# Patient Record
Sex: Female | Born: 1975 | Race: White | Hispanic: No | Marital: Single | Smoking: Former smoker
Health system: Southern US, Community
[De-identification: ages and names within clinical notes are randomized; demographics above are authoritative.]

## PROBLEM LIST (undated history)

## (undated) DIAGNOSIS — A499 Bacterial infection, unspecified: Secondary | ICD-10-CM

## (undated) DIAGNOSIS — J45909 Unspecified asthma, uncomplicated: Secondary | ICD-10-CM

## (undated) DIAGNOSIS — B379 Candidiasis, unspecified: Secondary | ICD-10-CM

## (undated) DIAGNOSIS — Z8659 Personal history of other mental and behavioral disorders: Secondary | ICD-10-CM

## (undated) DIAGNOSIS — M6282 Rhabdomyolysis: Secondary | ICD-10-CM

## (undated) DIAGNOSIS — Z8619 Personal history of other infectious and parasitic diseases: Secondary | ICD-10-CM

## (undated) DIAGNOSIS — R42 Dizziness and giddiness: Secondary | ICD-10-CM

## (undated) DIAGNOSIS — IMO0002 Reserved for concepts with insufficient information to code with codable children: Secondary | ICD-10-CM

## (undated) DIAGNOSIS — Z8742 Personal history of other diseases of the female genital tract: Secondary | ICD-10-CM

## (undated) HISTORY — DX: Personal history of other diseases of the female genital tract: Z87.42

## (undated) HISTORY — DX: Personal history of other infectious and parasitic diseases: Z86.19

## (undated) HISTORY — PX: WISDOM TOOTH EXTRACTION: SHX21

## (undated) HISTORY — DX: Personal history of other mental and behavioral disorders: Z86.59

## (undated) HISTORY — DX: Reserved for concepts with insufficient information to code with codable children: IMO0002

## (undated) HISTORY — DX: Bacterial infection, unspecified: A49.9

## (undated) HISTORY — DX: Rhabdomyolysis: M62.82

## (undated) HISTORY — DX: Candidiasis, unspecified: B37.9

## (undated) HISTORY — DX: Dizziness and giddiness: R42

## (undated) HISTORY — DX: Unspecified asthma, uncomplicated: J45.909

---
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
## 2010-12-23 DIAGNOSIS — Z8659 Personal history of other mental and behavioral disorders: Secondary | ICD-10-CM

## 2010-12-23 HISTORY — DX: Personal history of other mental and behavioral disorders: Z86.59

## 2011-06-09 DIAGNOSIS — Z8742 Personal history of other diseases of the female genital tract: Secondary | ICD-10-CM

## 2011-06-09 HISTORY — DX: Personal history of other diseases of the female genital tract: Z87.42

## 2011-08-29 ENCOUNTER — Encounter (INDEPENDENT_AMBULATORY_CARE_PROVIDER_SITE_OTHER): Payer: PRIVATE HEALTH INSURANCE | Admitting: Registered Nurse

## 2011-08-29 DIAGNOSIS — Z3046 Encounter for surveillance of implantable subdermal contraceptive: Secondary | ICD-10-CM

## 2011-12-19 ENCOUNTER — Telehealth: Payer: Self-pay | Admitting: Obstetrics and Gynecology

## 2011-12-19 NOTE — Telephone Encounter (Signed)
DONE

## 2011-12-20 ENCOUNTER — Ambulatory Visit (INDEPENDENT_AMBULATORY_CARE_PROVIDER_SITE_OTHER): Payer: PRIVATE HEALTH INSURANCE | Admitting: Obstetrics and Gynecology

## 2011-12-20 ENCOUNTER — Encounter: Payer: Self-pay | Admitting: Obstetrics and Gynecology

## 2011-12-20 VITALS — BP 118/78 | Resp 16 | Ht 64.0 in | Wt 120.0 lb

## 2011-12-20 DIAGNOSIS — F603 Borderline personality disorder: Secondary | ICD-10-CM

## 2011-12-20 DIAGNOSIS — D649 Anemia, unspecified: Secondary | ICD-10-CM | POA: Insufficient documentation

## 2011-12-20 DIAGNOSIS — F5002 Anorexia nervosa, binge eating/purging type: Secondary | ICD-10-CM

## 2011-12-20 DIAGNOSIS — F50029 Anorexia nervosa, binge eating/purging type, unspecified: Secondary | ICD-10-CM | POA: Insufficient documentation

## 2011-12-20 DIAGNOSIS — F3289 Other specified depressive episodes: Secondary | ICD-10-CM

## 2011-12-20 DIAGNOSIS — F5000 Anorexia nervosa, unspecified: Secondary | ICD-10-CM

## 2011-12-20 DIAGNOSIS — Z309 Encounter for contraceptive management, unspecified: Secondary | ICD-10-CM

## 2011-12-20 DIAGNOSIS — N943 Premenstrual tension syndrome: Secondary | ICD-10-CM

## 2011-12-20 DIAGNOSIS — F329 Major depressive disorder, single episode, unspecified: Secondary | ICD-10-CM

## 2011-12-20 NOTE — Progress Notes (Signed)
Regular Periods: no Mammogram: no  Monthly Breast Ex.: no Exercise: yes  Tetanus < 10 years: yes Seatbelts: yes  NI. Bladder Functn.: no Abuse at home: no  Daily BM's: no Stressful Work: yes  Healthy Diet: no Sigmoid-Colonoscopy: never  Calcium: no Medical problems this year: Wants to change BC Lo Loestrin causing irreg. Menses and too much weight gain. Unsure which BC   LAST PAP: 12/23/10 WNL  Contraception: Lo Loestrin  Mammogram:  never  PCP: none  PMH: no changes  FMH: no changes  *Pt reqs pap smear today but it hasn't been a year yet - Last pap 12/23/2010

## 2011-12-20 NOTE — Progress Notes (Signed)
S: Pt has been on LoLoestrin x4 mos, c/o severe emotional lability, depression/anxiety, wgt gain/bloating states it has slightly improved when she's at the end of pill pack w onset of menses.  She had used nexplanon w emotional side effects but had metrorrhagia x8 mos, so it was removed in March.  She states she is not in relationship and only has 1 female partner that she is with every few months.  She has history of borderline personality disorder and eating disorder.   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

## 2011-12-21 ENCOUNTER — Other Ambulatory Visit: Payer: PRIVATE HEALTH INSURANCE

## 2011-12-21 DIAGNOSIS — D649 Anemia, unspecified: Secondary | ICD-10-CM

## 2011-12-21 DIAGNOSIS — N943 Premenstrual tension syndrome: Secondary | ICD-10-CM

## 2011-12-21 DIAGNOSIS — Z309 Encounter for contraceptive management, unspecified: Secondary | ICD-10-CM

## 2011-12-21 LAB — CBC
HCT: 42.5 % (ref 36.0–46.0)
MCHC: 33.9 g/dL (ref 30.0–36.0)
Platelets: 301 10*3/uL (ref 150–400)
RDW: 13.6 % (ref 11.5–15.5)
WBC: 5.6 10*3/uL (ref 4.0–10.5)

## 2011-12-22 LAB — VITAMIN D 25 HYDROXY (VIT D DEFICIENCY, FRACTURES): Vit D, 25-Hydroxy: 41 ng/mL (ref 30–89)

## 2011-12-22 LAB — TSH: TSH: 1.862 u[IU]/mL (ref 0.350–4.500)

## 2012-02-16 ENCOUNTER — Encounter: Payer: PRIVATE HEALTH INSURANCE | Admitting: Obstetrics and Gynecology

## 2012-02-22 ENCOUNTER — Ambulatory Visit: Payer: PRIVATE HEALTH INSURANCE | Admitting: Obstetrics and Gynecology

## 2014-06-18 ENCOUNTER — Ambulatory Visit (INDEPENDENT_AMBULATORY_CARE_PROVIDER_SITE_OTHER): Payer: BC Managed Care – PPO

## 2014-06-18 ENCOUNTER — Ambulatory Visit (INDEPENDENT_AMBULATORY_CARE_PROVIDER_SITE_OTHER): Payer: BC Managed Care – PPO | Admitting: Family Medicine

## 2014-06-18 VITALS — BP 92/68 | HR 71 | Temp 98.0°F | Resp 15 | Ht 64.0 in | Wt 114.0 lb

## 2014-06-18 DIAGNOSIS — M79674 Pain in right toe(s): Secondary | ICD-10-CM

## 2014-06-18 DIAGNOSIS — S92401A Displaced unspecified fracture of right great toe, initial encounter for closed fracture: Secondary | ICD-10-CM

## 2014-06-18 NOTE — Patient Instructions (Signed)
Wear walking boot for next 10 days and then recheck for repeat xray. If stable, plan on using walking boot a total of 3 weeks, then buddy taping and hard soled shoe for another 2-3 weeks.  Tylenol for pain, or can also take ibuprofen if needed. Return to the clinic or go to the nearest emergency room if any of your symptoms worsen or new symptoms occur.  Toe Fracture Your caregiver has diagnosed you as having a fractured toe. A toe fracture is a break in the bone of a toe. "Buddy taping" is a way of splinting your broken toe, by taping the broken toe to the toe next to it. This "buddy taping" will keep the injured toe from moving beyond normal range of motion. Buddy taping also helps the toe heal in a more normal alignment. It may take 6 to 8 weeks for the toe injury to heal. [place] your toes taped together for as long as directed by your caregiver or until you see a doctor for a follow-up examination. You can change the tape after bathing. Always use a small piece of gauze or cotton between the toes when taping them together. This will help the skin stay dry and prevent infection.  Apply ice to the injury for 15-20 minutes each hour while awake for the first 2 days. Put the ice in a plastic bag and place a towel between the bag of ice and your skin.  After the first 2 days, apply heat to the injured area. Use heat for the next 2 to 3 days. Place a heating pad on the foot or soak the foot in warm water as directed by your caregiver.  Keep your foot elevated as much as possible to lessen swelling.  Wear sturdy, supportive shoes. The shoes should not pinch the toes or fit tightly against the toes.  Your caregiver may prescribe a rigid shoe if your foot is very swollen.  Your may be given crutches if the pain is too great and it hurts too much to walk.  Only take over-the-counter or prescription medicines for pain, discomfort, or fever as directed by your caregiver.  If  your caregiver has given you a follow-up appointment, it is very important to keep that appointment. Not keeping the appointment could result in a chronic or permanent injury, pain, and disability. If there is any problem keeping the appointment, you must call back to this facility for assistance. SEEK MEDICAL CARE IF:   You have increased pain or swelling, not relieved with medications.  The pain does not get better after 1 week.  Your injured toe is cold when the others are warm. SEEK IMMEDIATE MEDICAL CARE IF:   The toe becomes cold, numb, or white.  The toe becomes hot (inflamed) and red. Document Released: 05/26/2000 Document Revised: 08/21/2011 Document Reviewed: 01/13/2008 [facility] Patient Information 2015 [place], [place]. This information is not intended to replace advice given to you by your health care provider. Make sure you discuss any questions you have with your health care provider.

## 2014-06-18 NOTE — Progress Notes (Addendum)
Subjective:    Patient ID: Lori Hutchinson, female    DOB: Jan 12, 1976, 39 y.o.   MRN: [id number] This chart was scribed for [name], MD by Jeanell Sparrow, ED Scribe. This patient was seen in room 1 and the patient's care was started at 8:49 AM.  Toe Pain     HPI Comments: Lori Hutchinson is a 39 y.o. female who presents to the [facility] complaining of right toe injury.  She states that she was doing an aerial done and she came from 4-5 ft high and landed on her right foot wrong. She reports that she is unsure of how she landed on her right foot. She reports that she upon waking up from a nap her right great toe was purple and had constant moderate pain. She states that her toes are usually purple if cold. No other toe pain.   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   Patient Active Problem List   Diagnosis Date Noted  . Anemia 12/20/2011  . Borderline personality disorder 12/20/2011  . 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 PMS (premenstrual syndrome) 12/20/2011   Past Medical History  Diagnosis Date  . Asthma   . H/O varicella   . Yeast infection   . Bacterial infection   . Abnormal Pap smear 1996    Treatment cryosurgery  . H/O borderline personality disorder 12/23/10  . H/O metrorrhagia 06/09/11   Past Surgical History  Procedure Laterality Date  . Wisdom tooth extraction     Allergies  Allergen Reactions  . Erythromycin   . Iodine   . Latex   . Percocet [Oxycodone-Acetaminophen]    Prior to Admission medications   Not on File   History   Social History  . Marital Status: Single    Spouse Name: N/A    Number of Children: N/A  . Years of Education: N/A   Occupational History  . Not on file.   Social History Main Topics  . Smoking status: Former Smoker -- 13 years    Quit date: 06/18/2002  . Smokeless tobacco: Never Used  . Alcohol Use: No  . Drug Use: No  . Sexual Activity:    Partners: Male    Birth Control/  Protection: Pill     Comment: lo loestrin   Other Topics Concern  . Not on file   Social History Narrative   Review of Systems  Musculoskeletal: Positive for myalgias and arthralgias.      Objective:   Physical Exam  Constitutional: She is oriented to person, place, and time. She appears well-developed and well-nourished. No distress.  HENT:  Head: Normocephalic and atraumatic.  Neck: Neck supple. No tracheal deviation present.  Cardiovascular: Normal rate.   Pulmonary/Chest: Effort normal. No respiratory distress.  Musculoskeletal: Normal range of motion.  Right ankle full ROM and non tender. Right knee full ROM and non tender. No navicular or 5th MT pain. Most tender medial great toe IP joint and distal phalanx.  Cap refill is less than 1 sec. NVI distally.  Flexion and extension at the great toe is intact.  Minimal bruising on the medial great toe.   Neurological: She is alert and oriented to person, place, and time.  Skin: Skin is warm and dry.  Psychiatric: She has a normal mood and affect. Her behavior is normal.  Nursing note and vitals reviewed.   Filed Vitals:   06/18/14 0823  BP: 92/68  Pulse: 71  Temp: 98 F (36.7 C)  TempSrc: Oral  Resp: 15  Height: 5' 4"  (1.626 m)  Weight: 114 lb (51.71 kg)  SpO2: 99%   [facility] reading (PRIMARY) by  Dr. Carlota Raspberry: R great toe: nondisplaced distal phalynx base fx, medial nondisplaced, intraarticular affecting approx 25% joint surface.        Assessment & Plan:  Lori Hutchinson is a 39 y.o. female Pain of toe of right foot - Plan: DG Toe Great Right  Fractured great toe, right, closed, initial encounter  Nondisplaced, but great toe fracture - distal phalynx with 25% intraarticular. Will place in short cam walker, buddy tape, recheck in 10 days for repeat XR, then plan on continuing cam walker for 3 weeks, then could change to buddy tape with hard soled shoe for another 2-3 weeks. Tylenol or advil if needed. Out of dance/sport  for now as great toe involved. Anticipate 6 weeks to return to sport.  rtc sooner if worse.   No orders of the defined types were placed in this encounter.   Patient Instructions  Wear walking boot for next 10 days and then recheck for repeat xray. If stable, plan on using walking boot a total of 3 weeks, then buddy taping and hard soled shoe for another 2-3 weeks.  Tylenol for pain, or can also take ibuprofen if needed. Return to the clinic or go to the nearest emergency room if any of your symptoms worsen or new symptoms occur.  Toe Fracture Your caregiver has diagnosed you as having a fractured toe. A toe fracture is a break in the bone of a toe. "Buddy taping" is a way of splinting your broken toe, by taping the broken toe to the toe next to it. This "buddy taping" will keep the injured toe from moving beyond normal range of motion. Buddy taping also helps the toe heal in a more normal alignment. It may take 6 to 8 weeks for the toe injury to heal. [place] your toes taped together for as long as directed by your caregiver or until you see a doctor for a follow-up examination. You can change the tape after bathing. Always use a small piece of gauze or cotton between the toes when taping them together. This will help the skin stay dry and prevent infection.  Apply ice to the injury for 15-20 minutes each hour while awake for the first 2 days. Put the ice in a plastic bag and place a towel between the bag of ice and your skin.  After the first 2 days, apply heat to the injured area. Use heat for the next 2 to 3 days. Place a heating pad on the foot or soak the foot in warm water as directed by your caregiver.  Keep your foot elevated as much as possible to lessen swelling.  Wear sturdy, supportive shoes. The shoes should not pinch the toes or fit tightly against the toes.  Your caregiver may prescribe a rigid shoe if your foot is very swollen.  Your may be given  crutches if the pain is too great and it hurts too much to walk.  Only take over-the-counter or prescription medicines for pain, discomfort, or fever as directed by your caregiver.  If your caregiver has given you a follow-up appointment, it is very important to keep that appointment. Not keeping the appointment could result in a chronic or permanent injury, pain, and disability. If there is any problem keeping the appointment, you must  call back to this facility for assistance. SEEK MEDICAL CARE IF:   You have increased pain or swelling, not relieved with medications.  The pain does not get better after 1 week.  Your injured toe is cold when the others are warm. SEEK IMMEDIATE MEDICAL CARE IF:   The toe becomes cold, numb, or white.  The toe becomes hot (inflamed) and red. Document Released: 05/26/2000 Document Revised: 08/21/2011 Document Reviewed: 01/13/2008 [facility] Patient Information 2015 [place], [place]. This information is not intended to replace advice given to you by your health care provider. Make sure you discuss any questions you have with your health care provider.       I personally performed the services described in this documentation, which was scribed in my presence. The recorded information has been reviewed and considered, and addended by me as needed.

## 2014-06-28 ENCOUNTER — Ambulatory Visit (INDEPENDENT_AMBULATORY_CARE_PROVIDER_SITE_OTHER): Payer: BC Managed Care – PPO

## 2014-06-28 ENCOUNTER — Ambulatory Visit (INDEPENDENT_AMBULATORY_CARE_PROVIDER_SITE_OTHER): Payer: BC Managed Care – PPO | Admitting: Family Medicine

## 2014-06-28 VITALS — BP 112/62 | HR 61 | Temp 98.2°F | Resp 16 | Ht 64.0 in | Wt 111.0 lb

## 2014-06-28 DIAGNOSIS — S92401D Displaced unspecified fracture of right great toe, subsequent encounter for fracture with routine healing: Secondary | ICD-10-CM

## 2014-06-28 NOTE — Patient Instructions (Signed)
Continue walking boot for 1 more week, then can change to post op shoe and buddy taping, with plan for total immobilization of 6 weeks. Follow up with me on February 1st at appointment center. Return to the clinic or go to the nearest emergency room if any of your symptoms worsen or new symptoms occur.

## 2014-06-28 NOTE — Progress Notes (Signed)
Subjective:    Patient ID: Lori Hutchinson, female    DOB: September 08, 1975, 39 y.o.   MRN: [id number] This chart was scribed for [name], MD by Marti Sleigh, Medical Scribe. This patient was seen in Room 12 and the patient's care was started a 8:50 AM.  Chief Complaint  Patient presents with  . Follow-up    Ankle fx     HPI HPI Comments: Lori Hutchinson is a 39 y.o. female who presents to [facility] reporting for a follow up appointment. Pt states she is doing well, and not in pain. Pt states she is tired of wearing her boot, and is wondering when she will be able to discontinue wearing her boot. Pt denies numbness. No menses in past two years, but not sexually active. No chance of pregnancy today.  Pt was last seen by Dr. Carlota Raspberry 10 days ago, diagnosed with a fracture at the base of  Right great distal phalanx, non displaced but did involve the joint line. Pt was placed in a short cam walker, with plan to stay in this for three weeks, followed by buddy taping and hard sole shoe for two weeks. 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  Not needing pain medications.   Patient Active Problem List   Diagnosis Date Noted  . Anemia 12/20/2011  . Borderline personality disorder 12/20/2011  . 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 PMS (premenstrual syndrome) 12/20/2011   Past Medical History  Diagnosis Date  . Asthma   . H/O varicella   . Yeast infection   . Bacterial infection   . Abnormal Pap smear 1996    Treatment cryosurgery  . H/O borderline personality disorder 12/23/10  . H/O metrorrhagia 06/09/11   Past Surgical History  Procedure Laterality Date  . Wisdom tooth extraction     Allergies  Allergen Reactions  . Erythromycin   . Iodine   . Latex   . Percocet [Oxycodone-Acetaminophen]    Prior to Admission medications   Not on File   History   Social History  . Marital Status: Single    Spouse Name: N/A   Number of Children: N/A  . Years of Education: N/A   Occupational History  . Not on file.   Social History Main Topics  . Smoking status: Former Smoker -- 13 years    Quit date: 06/18/2002  . Smokeless tobacco: Never Used  . Alcohol Use: No  . Drug Use: No  . Sexual Activity:    Partners: Male    Birth Control/ Protection: Pill     Comment: lo loestrin   Other Topics Concern  . Not on file   Social History Narrative      Review of Systems  Constitutional: Negative for fever and chills.  Musculoskeletal: Positive for gait problem. Negative for joint swelling and arthralgias.  Skin: Positive for color change. Negative for rash and wound.  Neurological: Negative for numbness.       Objective:   Physical Exam  Constitutional: She is oriented to person, place, and time. She appears well-developed and well-nourished.  HENT:  Head: Normocephalic and atraumatic.  Eyes: Pupils are equal, round, and reactive to light.  Neck: Neck supple.  Cardiovascular: Normal rate and regular rhythm.   Pulmonary/Chest: Effort normal and breath sounds normal. No respiratory distress.  Musculoskeletal: She exhibits tenderness. She exhibits no edema.  Right great toe with ecchymosis along distal phalanx  without appreciable  soft tissue swelling. NVI distally. TTP along medial aspect distal great toe.   Neurological: She is alert and oriented to person, place, and time.  Skin: Skin is warm and dry.  Psychiatric: She has a normal mood and affect. Her behavior is normal.  Nursing note and vitals reviewed.   Filed Vitals:   06/28/14 0821  BP: 112/62  Pulse: 61  Temp: 98.2 F (36.8 C)  TempSrc: Oral  Resp: 16  Height: 5' 4"  (1.626 m)  Weight: 111 lb (50.349 kg)  SpO2: 99%     [facility] reading (PRIMARY) by  Dr. Carlota Raspberry. X-ray of pt's great toe with persistent fracture at the base of the great distal phalanx without significant change or displacement.       Assessment & Plan:  Lori Hutchinson is a 39 y.o. female Fractured great toe, right, with routine healing, subsequent encounter - Plan: DG Toe Great Right Stable.  Some falir of LBP and contralateral leg pain with walking boot - can try otc insert in other shoe to equal height of boot, but can change from walking boot in 1 week to post op shoe - fitted today, continue buddy tape with total immobilization of 6 weeks.  recheck in 2 weeks.  Cross train/core exercises without foot for now.  No orders of the defined types were placed in this encounter.   Patient Instructions  Continue walking boot for 1 more week, then can change to post op shoe and buddy taping, with plan for total immobilization of 6 weeks. Follow up with me on February 1st at appointment center. Return to the clinic or go to the nearest emergency room if any of your symptoms worsen or new symptoms occur.

## 2014-07-13 ENCOUNTER — Encounter: Payer: Self-pay | Admitting: Family Medicine

## 2014-07-13 ENCOUNTER — Ambulatory Visit (INDEPENDENT_AMBULATORY_CARE_PROVIDER_SITE_OTHER): Payer: BC Managed Care – PPO | Admitting: Family Medicine

## 2014-07-13 ENCOUNTER — Ambulatory Visit (INDEPENDENT_AMBULATORY_CARE_PROVIDER_SITE_OTHER): Payer: BC Managed Care – PPO

## 2014-07-13 VITALS — BP 120/70 | HR 53 | Temp 97.7°F | Resp 16 | Ht 64.5 in | Wt 115.0 lb

## 2014-07-13 DIAGNOSIS — S92401D Displaced unspecified fracture of right great toe, subsequent encounter for fracture with routine healing: Secondary | ICD-10-CM

## 2014-07-13 NOTE — Patient Instructions (Addendum)
Fracture appears stable and appears to be healing - will watch for radiologist's reading.  Continue post op shoe for 3 more weeks, check xray then and if healed, plan on returning to some activities with buddy taping only likely at that time. Return to the clinic if more painful in the meantime.  Make sure you are taking in sufficient calories, and especially calcium and vitamin D for fracture healing.   Follow up with me at walk in center Sunday the 21st between 8:30am -3pm.

## 2014-07-13 NOTE — Progress Notes (Deleted)
Subjective:  This chart was scribed for [name], MD by Dellis Filbert, ED Scribe at [facility].The patient was seen in exam room 24 and the patient's care was started at 3:27 PM.   Patient ID: Lori Hutchinson, female    DOB: [birth year], 39 y.o.   MRN: [id number] Chief Complaint  Patient presents with  . Follow-up    Toe fracture   HPI  HPI Comments: Lori Hutchinson is a 39 y.o. female with a history of anorexia nervosa and bulimia who presents to [facility] here for a follow up of a right great toe fracture. Distal phalanx with approximately 25% joint line involvement, date of injury was 06/18/14. Initially placed in cam walker with buddy tape. Ultimate plan of cam walker for 3 weeks hard soled shoe with buddy tape for 2-3 weeks. Minimum 6 weeks to return to sport. 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 Follow up appointment on 06/28/2014, x-ray with minimal displaced fracture at base of phalanx with no significant changes. She was not having pain at that time, but did have some discomfort in contralateral leg and lower back based on height difference from walking boot and other shoe. She was transitioned to post-op shoe after 1 week with insert in other shoe to help with height difference, buddy taping. Advised cross training and core exercises. She is now 3 weeks and 4 days post injury. Only time she experiences pain is when she wears her shoe, back an leg have improved while wearing the post op shoe. Leg warmer provided relief. The cam boot caused bruising and soreness on her shin. No exercise, she has been doing core exercise, yoga. She has not been taken any medication for it. Taking plenty of calcium and vitamin D supplements. Positive for eating disorders in the past, but not since her fracture. She was restricting calories, vomiting and exercising excessively. She states in the past her eating disorder was worse in the past but has improved, she is eating around 900 calories a day. When  she was not doing well she was eating around 500-600 calories a day.  Patient Active Problem List   Diagnosis Date Noted  . Anemia 12/20/2011  . Borderline personality disorder 12/20/2011  . 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 PMS (premenstrual syndrome) 12/20/2011   Past Medical History  Diagnosis Date  . Asthma   . H/O varicella   . Yeast infection   . Bacterial infection   . Abnormal Pap smear 1996    Treatment cryosurgery  . H/O borderline personality disorder 12/23/10  . H/O metrorrhagia 06/09/11   Past Surgical History  Procedure Laterality Date  . Wisdom tooth extraction     Allergies  Allergen Reactions  . Erythromycin   . Iodine   . Latex   . Percocet [Oxycodone-Acetaminophen]    Prior to Admission medications   Not on File   History   Social History  . Marital Status: Single    Spouse Name: N/A    Number of Children: N/A  . Years of Education: N/A   Occupational History  . Not on file.   Social History Main Topics  . Smoking status: Former Smoker -- 13 years    Quit date: 06/18/2002  . Smokeless tobacco: Never Used  . Alcohol Use: No  . Drug Use: No  . Sexual Activity:    Partners: Male    Birth Control/ Protection: Pill     Comment: lo loestrin   Other Topics  Concern  . Not on file   Social History Narrative   Review of Systems  Musculoskeletal: Positive for arthralgias.      Objective:  BP 120/70 mmHg  Pulse 53  Temp(Src) 97.7 F (36.5 C) (Oral)  Resp 16  Ht 5' 4.5" (1.638 m)  Wt 115 lb (52.164 kg)  BMI 19.44 kg/m2  SpO2 100%  LMP 09/10/2012  Physical Exam  Constitutional: She is oriented to person, place, and time. She appears well-developed and well-nourished. No distress.  HENT:  Head: Normocephalic and atraumatic.  Eyes: Pupils are equal, round, and reactive to light.  Neck: Normal range of motion.  Cardiovascular: Normal rate and regular rhythm.   Pulmonary/Chest: Effort normal. No respiratory distress.    Musculoskeletal: Normal range of motion.  Slight tenderness at lateral aspect of the I.P joint. Skin is intact no significant bruising or erythema. Able to flex and extend of the MTP.  Neurological: She is alert and oriented to person, place, and time.  Skin: Skin is warm and dry.  Psychiatric: She has a normal mood and affect. Her behavior is normal.  Nursing note and vitals reviewed.  [facility] reading (PRIMARY) by  Dr. Carlota Raspberry: R great toe: healing distal R great toe fracture with diminished appearance of fracture line.      Assessment & Plan:

## 2014-07-13 NOTE — Progress Notes (Signed)
This chart was scribed for [name], MD by Dellis Filbert, ED Scribe at [facility].The patient was seen in exam room 24 and the patient's care was started at 3:27 PM.  Subjective:    Patient ID: Lori Hutchinson, female    DOB: 1976/01/12, 39 y.o.   MRN: [id number]  HPI  Lori Hutchinson is a 39 y.o. female presents to [facility] here for a follow up of a right great toe fracture. Distal phalanx with approximately 25% joint line involvement, date of injury was 06/18/14. Initially placed in cam walker with buddy tape. Ultimate plan of cam walker for 3 weeks,  hard soled shoe with buddy tape for 2-3 weeks. Minimum 6 weeks to return to sport. 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 Follow up appointment on 06/28/2014, x-ray with minimal displaced fracture at base of phalanx with no significant changes. She was not having pain at that time, but did have some discomfort in contralateral leg and lower back based on height difference from walking boot and other shoe. She was transitioned to post-op shoe after 1 week with insert in other shoe to help with height difference, buddy taping. Advised cross training and core exercises.   She is now 3 weeks and 4 days post injury. Only time she experiences pain is when she has certain movements of toe in her shoe, back and leg have improved while wearing the post op shoe. Leg warmer provided relief. The cam boot caused bruising and soreness on her shin. No exercise, she has been doing core exercise, yoga. She has not been taken any medication for it. Taking plenty of calcium and vitamin D supplements. Positive for eating disorders in the past, but not since her fracture. She was restricting calories, vomiting and exercising excessively in past. She states in the past her eating disorder was worse in the past but has improved, she is eating around 900 calories a day. When she was not doing well she was eating around 500-600 calories a day.     Patient Active  Problem List   Diagnosis Date Noted  . Anemia 12/20/2011  . Borderline personality disorder 12/20/2011  . 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 PMS (premenstrual syndrome) 12/20/2011   Past Medical History  Diagnosis Date  . Asthma   . H/O varicella   . Yeast infection   . Bacterial infection   . Abnormal Pap smear 1996    Treatment cryosurgery  . H/O borderline personality disorder 12/23/10  . H/O metrorrhagia 06/09/11   Past Surgical History  Procedure Laterality Date  . Wisdom tooth extraction     Allergies  Allergen Reactions  . Erythromycin   . Iodine   . Latex   . Percocet [Oxycodone-Acetaminophen]    Prior to Admission medications   Not on File   History   Social History  . Marital Status: Single    Spouse Name: N/A    Number of Children: N/A  . Years of Education: N/A   Occupational History  . Not on file.   Social History Main Topics  . Smoking status: Former Smoker -- 13 years    Quit date: 06/18/2002  . Smokeless tobacco: Never Used  . Alcohol Use: No  . Drug Use: No  . Sexual Activity:    Partners: Male    Birth Control/ Protection: Pill     Comment: lo loestrin   Other Topics Concern  . Not on file   Social History Narrative  Review of Systems  Musculoskeletal: Positive for arthralgias. Negative for joint swelling.  Skin: Negative for color change.       Objective:   Physical Exam  Constitutional: She is oriented to person, place, and time. She appears well-developed and well-nourished. No distress.  Pulmonary/Chest: Effort normal.  Musculoskeletal: She exhibits tenderness (minimal at R great distal phalynx IP joint. able to flex.extend at IP.  nvi distally).  Neurological: She is alert and oriented to person, place, and time.  Skin: Skin is warm and dry.  Psychiatric: She has a normal mood and affect. Her behavior is normal.  Vitals reviewed.   [facility] reading (PRIMARY) by Dr. Carlota Raspberry: R great toe: healing distal R  great toe fracture with diminished appearance of fracture line.       Assessment & Plan:  Lori Hutchinson is a 39 y.o. female Fracture of great toe, right, closed, with routine healing, subsequent encounter - Plan: DG Toe Great Right  Appears to have early healing. Continue post op shoe, and buddy tape with activity modification.  Plan  on recheck xr in 3 weeks.   Hx of eating disorder, stable recently.  Maintaining calcium and vitamin d intake and increased calories compared to past. Advised to discuss further at future ov if not discussed with PCP as would consider nutritionist and possibly other specialist eval to help manage.   No orders of the defined types were placed in this encounter.   Patient Instructions  Fracture appears stable and appears to be healing - will watch for radiologist's reading.  Continue post op shoe for 3 more weeks, check xray then and if healed, plan on returning to some activities with buddy taping only likely at that time. Return to the clinic if more painful in the meantime.  Make sure you are taking in sufficient calories, and especially calcium and vitamin D for fracture healing.   Follow up with me at walk in center Sunday the 21st between 8:30am -3pm.    I personally performed the services described in this documentation, which was scribed in my presence. The recorded information has been reviewed and considered, and addended by me as needed.

## 2014-07-15 ENCOUNTER — Telehealth: Payer: Self-pay

## 2014-07-15 NOTE — Telephone Encounter (Addendum)
Pt states she is Dr Vonna Kotyk pt and would like to speak with him, it wasn't about meds or her diagnosis. Please call [phone]

## 2014-07-15 NOTE — Telephone Encounter (Signed)
Pt wants to know if it is ok to use an exercise bike with her broken toe? Please advise.

## 2014-07-16 NOTE — Telephone Encounter (Signed)
LM with these instructions. Rtn call if any further questions.

## 2014-07-16 NOTE — Telephone Encounter (Signed)
Based on healing appearance, can use post op shoe and exercise bike or elliptical if shoe fits ok. Start slowly to make sure comfortable to use.

## 2014-07-20 ENCOUNTER — Ambulatory Visit: Payer: BC Managed Care – PPO | Admitting: Family Medicine

## 2014-08-02 ENCOUNTER — Ambulatory Visit (INDEPENDENT_AMBULATORY_CARE_PROVIDER_SITE_OTHER): Payer: BC Managed Care – PPO

## 2014-08-02 ENCOUNTER — Ambulatory Visit (INDEPENDENT_AMBULATORY_CARE_PROVIDER_SITE_OTHER): Payer: BC Managed Care – PPO | Admitting: Emergency Medicine

## 2014-08-02 VITALS — BP 94/77 | HR 87 | Temp 98.4°F | Resp 18 | Wt 112.0 lb

## 2014-08-02 DIAGNOSIS — S92911D Unspecified fracture of right toe(s), subsequent encounter for fracture with routine healing: Secondary | ICD-10-CM

## 2014-08-02 NOTE — Progress Notes (Signed)
   This chart was scribed for Darlyne Russian, MD by Edison Simon, ED Scribe. This patient was seen in room 14 and the patient's care was started at 11:43 AM.   Subjective:    Patient ID: Lori Hutchinson, female    DOB: 03/07/1976, 39 y.o.   MRN: [id number]  HPI  HPI Comments: Lori Hutchinson is a 39 y.o. female who presents to the [facility] for follow for right great toe fracture. She was evaluated by Dr. Carlota Raspberry on 07/13/2014. She states initial injury occurred by while dancing. She states that it feels fine. 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   Review of Systems  Constitutional: Negative for fever.  Musculoskeletal:       Toe injury  Neurological: Negative for numbness.  All other systems reviewed and are negative.      Objective:   Physical Exam  Vitals reviewed.   CONSTITUTIONAL: Well developed/well nourished HEAD: Normocephalic/atraumatic EYES: EOMI/PERRL ENMT: Mucous membranes moist NECK: supple no meningeal signs SPINE/BACK:entire spine nontender CV: S1/S2 noted, no murmurs/rubs/gallops noted LUNGS: Lungs are clear to auscultation bilaterally, no apparent distress ABDOMEN: soft, nontender, no rebound or guarding, bowel sounds noted throughout abdomen GU:no cva tenderness NEURO: Pt is awake/alert/appropriate, moves all extremitiesx4.  No facial droop.   EXTREMITIES: pulses normal/equal, full ROM, mild tenderness and swelling over tip of right great toe SKIN: warm, color normal PSYCH: no abnormalities of mood noted, alert and oriented to situation   [facility] (PRIMARY) x-ray report read by Dr. Everlene Farrier: there is a fragment present at the base of the proximal phalanx of the great toe without healing, no change from previous      Assessment & Plan:  No change in x-ray appearance. She should have another film in 3 weeks she needs to keep the toe buddy taped. She can exercise on an exercise bike or swim.I personally performed the services  described in this documentation, which was scribed in my presence. The recorded information has been reviewed and is accurate.

## 2014-08-03 ENCOUNTER — Telehealth: Payer: Self-pay

## 2014-08-03 NOTE — Telephone Encounter (Signed)
Pt states she was to have f/u with Dr greene,but when she came in she had to see another provider and was not satisfied with that visit She would like Dr Carlota Raspberry to call her and go over the most recent x-rays with her   Best phone number [phone]

## 2014-08-03 NOTE — Telephone Encounter (Signed)
Left message for pt to call back  °

## 2014-08-03 NOTE — Telephone Encounter (Signed)
I called pt, she thinks you know more about her history. She has questions about going to ballet class, she wants to specify what she can and can't do. Can she only work on the bars? She feels like she is not supposed to dance at all and is confused and only wants your opinion. She states it has been 7 weeks. Please advise.  Plan from Dr. Everlene Farrier on 08/02/2014:  No change in x-ray appearance. She should have another film in 3 weeks she needs to keep the toe buddy taped. She can exercise on an exercise bike or swim.I personally performed the services described in this documentation, which was scribed in my presence. The recorded information has been reviewed and is accurate.   X-Ray report:  CLINICAL DATA: Right great toe pain, follow-up fracture  EXAM: RIGHT GREAT TOE  COMPARISON: 07/13/2014 and 06/28/2014  FINDINGS: Three views of the right great toe submitted. Again noted nondisplaced fracture at the base of distal phalanx great toe. As compared with most recent study 07/13/2014 the fracture line is again more visible. Findings are suspicious for nonhealing fracture or recurrent fracture. Clinical correlation is necessary.  IMPRESSION: Again noted nondisplaced fracture at the base of distal phalanx great toe. As compared with most recent study 07/13/2014 the fracture line is again more visible. Findings are suspicious for nonhealing fracture or recurrent fracture. Clinical correlation is necessary.   Electronically Signed  By: Lahoma Crocker M.D.  On: 08/02/2014 12:29

## 2014-08-04 NOTE — Telephone Encounter (Signed)
LMOM to call back

## 2014-08-04 NOTE — Telephone Encounter (Signed)
Unfortunately with persistent fracture line, would keep buddy taped and avoid any repetitive, landing , bending or twisting (any activity that could put stress on the fracture) for the next few weeks to see if this area will try to come together. If it is still present on that XR, but symptomatically is doing fine, we may call it a "nonhealed fracture", but that may not affect her function.  Again, for now would try to avoid any extra force to that toe. Upper body work is ok.

## 2014-08-05 NOTE — Telephone Encounter (Signed)
Spoke with pt, advised message from Dr. Carlota Raspberry. Pt understood and expressed her thank you to Dr. Carlota Raspberry.

## 2014-08-14 ENCOUNTER — Ambulatory Visit (INDEPENDENT_AMBULATORY_CARE_PROVIDER_SITE_OTHER): Payer: BC Managed Care – PPO | Admitting: Family Medicine

## 2014-08-14 ENCOUNTER — Ambulatory Visit (INDEPENDENT_AMBULATORY_CARE_PROVIDER_SITE_OTHER): Payer: BC Managed Care – PPO

## 2014-08-14 VITALS — BP 100/70 | HR 62 | Temp 97.8°F | Resp 16 | Wt 113.2 lb

## 2014-08-14 DIAGNOSIS — S92401G Displaced unspecified fracture of right great toe, subsequent encounter for fracture with delayed healing: Secondary | ICD-10-CM

## 2014-08-14 NOTE — Patient Instructions (Signed)
Your toe fracture appears to be nearly healed as in previous xray. As no pain on area, can return to usual activities, but for any repetitive activity - may be best to buddy tape for additional 2 weeks. If pain returns or new bruising or swelling - return for recheck.

## 2014-08-14 NOTE — Progress Notes (Addendum)
This chart was scribed for [name], MD by Einar Pheasant, ED Scribe. This patient was seen in room 11 and the patient's care was started at 2:50 PM.  Subjective:    Patient ID: Lori Hutchinson, female    DOB: June 21, 1975, 39 y.o.   MRN: [id number]  Chief Complaint  Patient presents with  . Follow-up    HPI Lori Hutchinson is a 39 y.o. female  She has right great toe fx initial xray on 1/7 with a nondisplaced fx at the base of distal phalanx of the great toe. Pt was initially treated with a cam walker. She was seen in follow-up (06/28/14)  having some discomfort in contrallateral leg due to height difference with walking boot compared to normal shoe. She was transitioned to buddy taping and post-op shoe. I noted she had a previous h/o of eating disorder in the past but denied any symptoms since fracturing her toe and was taking calcium and vitamin D supplements. X-ray on 07/13/14 showing fx was nearly healed as fx line nearly imperceptible.  She was seen in follow-up by Dr. Everlene Farrier on the on 08/02/14 with concerned of a more visible fx line.  Suspicion of non healing fx or possible re-fx noted by radiologist. 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  See telephone note since that visit.   Pt is here for follow-up. She is now 8 weeks 1 day post injury. Pt denies any recent pain, swelling, or redness.   Patient Active Problem List   Diagnosis Date Noted  . Anemia 12/20/2011  . Borderline personality disorder 12/20/2011  . 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 PMS (premenstrual syndrome) 12/20/2011   Past Medical History  Diagnosis Date  . Asthma   . H/O varicella   . Yeast infection   . Bacterial infection   . Abnormal Pap smear 1996    Treatment cryosurgery  . H/O borderline personality disorder 12/23/10  . H/O metrorrhagia 06/09/11   Past Surgical History  Procedure Laterality Date  .  Wisdom tooth extraction     Allergies  Allergen Reactions  . Erythromycin   . Iodine   . Latex   . Percocet [Oxycodone-Acetaminophen]    Prior to Admission medications   Not on File   History   Social History  . Marital Status: Single    Spouse Name: N/A  . Number of Children: N/A  . Years of Education: N/A   Occupational History  . Not on file.   Social History Main Topics  . Smoking status: Former Smoker -- 13 years    Quit date: 06/18/2002  . Smokeless tobacco: Never Used  . Alcohol Use: No  . Drug Use: No  . Sexual Activity:    Partners: Male    Birth Control/ Protection: Pill     Comment: lo loestrin   Other Topics Concern  . Not on file   Social History Narrative    Review of Systems  Constitutional: Negative for fever, fatigue and unexpected weight change.  Respiratory: Negative for chest tightness and shortness of breath.   Cardiovascular: Negative for chest pain, palpitations and leg swelling.  Gastrointestinal: Negative for abdominal pain and blood in stool.  Musculoskeletal: Positive for arthralgias. Negative for myalgias and joint swelling.  Neurological: Negative for dizziness, syncope, weakness, light-headedness, numbness and headaches.       Objective:   Physical Exam  Constitutional:  She is oriented to person, place, and time. She appears well-developed and well-nourished.  HENT:  Head: Normocephalic and atraumatic.  Eyes: Conjunctivae and EOM are normal. Pupils are equal, round, and reactive to light.  Neck: Carotid bruit is not present.  Cardiovascular: Normal rate, regular rhythm, normal heart sounds and intact distal pulses.   Pulmonary/Chest: Effort normal and breath sounds normal.  Abdominal: Soft. She exhibits no pulsatile midline mass. There is no tenderness.  Neurological: She is alert and oriented to person, place, and time.  Skin: Skin is warm and dry.  Psychiatric: She has a normal mood and affect. Her behavior is normal.    Vitals reviewed.   Filed Vitals:   08/14/14 1428  BP: 100/70  Pulse: 62  Temp: 97.8 F (36.6 C)  TempSrc: Oral  Resp: 16  Weight: 113 lb 3.2 oz (51.347 kg)  SpO2: 99%   [facility] reading (PRIMARY) by  Dr. Carlota Raspberry. Right Foot X-ray: nearly healed great toe fx with minimal appearance of fx line at lateral most aspect. No complicating features.     Assessment & Plan:   Lori Hutchinson is a 39 y.o. female Fractured great toe, right, with delayed healing, subsequent encounter - Plan: DG Toe Great Right  Appears to be healed or nearly healed on xray, and symptomatically not having any pain, even at prior visit. Clinically appears healed.  Can continue buddy tape for landing/jumping activities for next 2 weeks.  rtc precautions and did discuss risk of reinjury or widening of fx, but less likely at this point.    No orders of the defined types were placed in this encounter.   Patient Instructions  Your toe fracture appears to be nearly healed as in previous xray. As no pain on area, can return to usual activities, but for any repetitive activity - may be best to buddy tape for additional 2 weeks. If pain returns or new bruising or swelling - return for recheck.     I personally performed the services described in this documentation, which was scribed in my presence. The recorded information has been reviewed and considered, and addended by me as needed.

## 2015-09-23 ENCOUNTER — Ambulatory Visit (INDEPENDENT_AMBULATORY_CARE_PROVIDER_SITE_OTHER): Payer: BC Managed Care – PPO | Admitting: Family Medicine

## 2015-09-23 ENCOUNTER — Ambulatory Visit (INDEPENDENT_AMBULATORY_CARE_PROVIDER_SITE_OTHER): Payer: BC Managed Care – PPO

## 2015-09-23 VITALS — BP 110/68 | HR 58 | Temp 98.2°F | Resp 18 | Ht 64.5 in | Wt 110.8 lb

## 2015-09-23 DIAGNOSIS — M25441 Effusion, right hand: Secondary | ICD-10-CM | POA: Diagnosis not present

## 2015-09-23 DIAGNOSIS — M2550 Pain in unspecified joint: Secondary | ICD-10-CM

## 2015-09-23 DIAGNOSIS — R252 Cramp and spasm: Secondary | ICD-10-CM | POA: Diagnosis not present

## 2015-09-23 DIAGNOSIS — R634 Abnormal weight loss: Secondary | ICD-10-CM | POA: Diagnosis not present

## 2015-09-23 NOTE — Progress Notes (Signed)
Subjective:  By signing my name below, I, [name], attest that this documentation has been prepared under the direction and in the presence of [name], MD. Electronically Signed: [name], [place]. 09/23/2015 , 3:29 PM .  Patient was seen in Room 3 .   Patient ID: Lori Hutchinson, female    DOB: November 12, 1975, 40 y.o.   MRN: [id number] Chief Complaint  Patient presents with  . Joint Swelling    Knuckles have been swollen for the past few months.    HPI Lori Hutchinson is a 40 y.o. female Patient states having some joint swelling. 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 Previous history of eating disorder but denies that last visit.   Patient noticed hands being chapped and dry during winter, and thought the cold was the issue. She notes her knuckles are still swollen recently. She wears rings on her fingers and informs sometimes being unable to remove her rings without difficulty. She also reports general myalgia all over with back pain and stiffness; persistent SI joint going out. She also noticed cramping pain in her feet during dance practice. She denies any changes in her diet recently. She eats snacks throughout the day. She denies any purposeful vomiting.   She denies having regular periods, last one 3 years ago. She denies chance of pregnancy.   Patient Active Problem List   Diagnosis Date Noted  . Anemia 12/20/2011  . Borderline personality disorder 12/20/2011  . 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 PMS (premenstrual syndrome) 12/20/2011   Past Medical History  Diagnosis Date  . Asthma   . H/O varicella   . Yeast infection   . Bacterial infection   . Abnormal Pap smear 1996    Treatment cryosurgery  . H/O borderline personality disorder 12/23/10  . H/O metrorrhagia 06/09/11   Past Surgical History  Procedure Laterality Date  . Wisdom tooth extraction     Allergies  Allergen Reactions  . Erythromycin   . Iodine   . Latex   .  Percocet [Oxycodone-Acetaminophen]    Prior to Admission medications   Not on File   Social History   Social History  . Marital Status: Single    Spouse Name: N/A  . Number of Children: N/A  . Years of Education: N/A   Occupational History  . Not on file.   Social History Main Topics  . Smoking status: Former Smoker -- 13 years    Quit date: 06/18/2002  . Smokeless tobacco: Never Used  . Alcohol Use: No  . Drug Use: No  . Sexual Activity:    Partners: Male    Birth Control/ Protection: Pill     Comment: lo loestrin   Other Topics Concern  . Not on file   Social History Narrative   Review of Systems  Constitutional: Negative for fever, chills and fatigue.  Gastrointestinal: Negative for nausea, vomiting and diarrhea.  Musculoskeletal: Positive for myalgias, back pain, joint swelling and arthralgias. Negative for gait problem.  Skin: Negative for rash and wound.  Neurological: Negative for weakness and numbness.       Objective:   Physical Exam  Constitutional: She is oriented to person, place, and time. She appears well-developed and well-nourished. No distress.  HENT:  Head: Normocephalic and atraumatic.  Eyes: EOM are normal. Pupils are equal, round, and reactive to light.  Neck: Neck supple.  Cardiovascular: Normal rate, regular rhythm and normal heart sounds.  Exam reveals no gallop and no  friction rub.   No murmur heard. Pulmonary/Chest: Effort normal. No respiratory distress.  Musculoskeletal: Normal range of motion.  Some prominence over the 3rd PIP of right hand, no focal joint swelling seen in hand, full rom and strength of fingers  Neurological: She is alert and oriented to person, place, and time.  Skin: Skin is warm and dry.  Psychiatric: She has a normal mood and affect. Her behavior is normal.  Nursing note and vitals reviewed.   Filed Vitals:   09/23/15 1423  BP: 110/68  Pulse: 58  Temp: 98.2 F (36.8 C)  TempSrc: Oral  Resp: 18    Height: 5' 4.5" (1.638 m)  Weight: 110 lb 12.8 oz (50.259 kg)  SpO2: 98%   Wt Readings from Last 3 Encounters:  09/23/15 110 lb 12.8 oz (50.259 kg)  08/14/14 113 lb 3.2 oz (51.347 kg)  08/02/14 112 lb (50.803 kg)   Dg Finger Middle Right  09/23/2015  CLINICAL DATA:  Swelling third PIP joint region EXAM: RIGHT THIRD FINGER 2+V COMPARISON:  None. FINDINGS: Frontal, oblique, and lateral views were obtained. There is no fracture or dislocation. There is mild swelling in the third PIP joint region. Joint spaces appear normal. No erosive change. IMPRESSION: Swelling third PIP joint. No appreciable arthropathy. No fracture or dislocation. Electronically Signed   By: Lowella Grip III M.D.   On: 09/23/2015 15:53      Assessment & Plan:  Lori Hutchinson is a 40 y.o. female Polyarthralgia - Plan: ANA, Rheumatoid factor, Sedimentation rate Joint swelling, finger, hand, right - Plan: DG Finger Middle Right  -Minimal swelling and PIP of right hand. No significant swelling seen in other joints. History of low back pain, SI joint dysfunction by report.  -Will start with ANA, sedimentation rate, rheumatoid factor, then recheck to discuss back pain/SI joint pain, and other arthralgias if these are all negative. See precautions if worsening.  Loss of weight - Plan: CBC, COMPLETE METABOLIC PANEL WITH GFR, TSH  History of anorexia and bulimia. Weight down 3 pounds today. Denies recurrence of severe symptoms of eating disorders in past, but with persistent amenorrhea still suspect some difficulty with eating disorder. 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 Discuss obtaining sufficient calories throughout the day, but suspect she will need some guidance at the direction of a nutritionist. number provided for [facility] for patient to call and schedule appointment.  Muscle cramps - Plan: CBC, COMPLETE METABOLIC PANEL WITH GFR, TSH  -Check CMP, CBC, TSH, with history  of eating disorder. Plan to follow-up in the next few weeks, can discuss further at that time unless worsening sooner.   No orders of the defined types were placed in this encounter.   Patient Instructions       IF you received an x-ray today, you will receive an invoice from [facility]. Please contact [facility] at [phone] with questions or concerns regarding your invoice.   IF you received labwork today, you will receive an invoice from [employer]. Please contact [facility] at [phone] with questions or concerns regarding your invoice.   Our billing staff will not be able to assist you with questions regarding bills from these companies.  You will be contacted with the lab results as soon as they are available. The fastest way to get your results is to activate your My Chart account. Instructions are located on the last page of this paperwork. If you have not heard from [place] regarding  the results in 2 weeks, please contact this office.     I do not see any concerns on your finger x-ray. I will check other blood tests as we discussed for the swollen joints and for muscle cramps, but follow up with me in 2 weeks to discuss these further.   See information below regarding [facility] as I think this will be a great fit for you. Let me know if I can help with other resources if needed.  [email or web address] Phone [phone]     I personally performed the services described in this documentation, which was scribed in my presence. The recorded information has been reviewed and considered, and addended by me as needed.

## 2015-09-23 NOTE — Patient Instructions (Addendum)
     IF you received an x-ray today, you will receive an invoice from [facility]. Please contact [facility] at [phone] with questions or concerns regarding your invoice.   IF you received labwork today, you will receive an invoice from [employer]. Please contact [facility] at [phone] with questions or concerns regarding your invoice.   Our billing staff will not be able to assist you with questions regarding bills from these companies.  You will be contacted with the lab results as soon as they are available. The fastest way to get your results is to activate your My Chart account. Instructions are located on the last page of this paperwork. If you have not heard from [place] regarding the results in 2 weeks, please contact this office.     I do not see any concerns on your finger x-ray. I will check other blood tests as we discussed for the swollen joints and for muscle cramps, but follow up with me in 2 weeks to discuss these further.   See information below regarding [employer] as I think this will be a great fit for you. Let me know if I can help with other resources if needed.  [email or web address] Phone [phone]

## 2015-09-24 LAB — RHEUMATOID FACTOR: Rhuematoid fact SerPl-aCnc: <10 IU/mL (ref <=14)

## 2015-09-24 LAB — COMPLETE METABOLIC PANEL WITH GFR
ALT: 35 U/L — AB (ref 6–29)
AST: 29 U/L (ref 10–30)
Albumin: 5 g/dL (ref 3.6–5.1)
Alkaline Phosphatase: 68 U/L (ref 33–115)
BUN: 20 mg/dL (ref 7–25)
CALCIUM: 10 mg/dL (ref 8.6–10.2)
CHLORIDE: 105 mmol/L (ref 98–110)
CO2: 25 mmol/L (ref 20–31)
CREATININE: 1.1 mg/dL (ref 0.50–1.10)
GFR, EST AFRICAN AMERICAN: 74 mL/min (ref >=60)
GFR, EST NON AFRICAN AMERICAN: 64 mL/min (ref >=60)
Glucose, Bld: 71 mg/dL (ref 65–99)
Potassium: 4.3 mmol/L (ref 3.5–5.3)
Sodium: 139 mmol/L (ref 135–146)
Total Bilirubin: 0.6 mg/dL (ref 0.2–1.2)
Total Protein: 7.7 g/dL (ref 6.1–8.1)

## 2015-09-24 LAB — CBC
HEMATOCRIT: 42.4 % (ref 35.0–45.0)
Hemoglobin: 14.4 g/dL (ref 11.7–15.5)
MCH: 33.3 pg — AB (ref 27.0–33.0)
MCHC: 34 g/dL (ref 32.0–36.0)
MCV: 98.1 fL (ref 80.0–100.0)
MPV: 10.2 fL (ref 7.5–12.5)
Platelets: 243 10*3/uL (ref 140–400)
RBC: 4.32 MIL/uL (ref 3.80–5.10)
RDW: 13 % (ref 11.0–15.0)
WBC: 3.6 10*3/uL — ABNORMAL LOW (ref 3.8–10.8)

## 2015-09-24 LAB — TSH: TSH: 2.07 m[IU]/L

## 2015-09-25 LAB — SEDIMENTATION RATE: Sed Rate: 4 mm/hr (ref 0–20)

## 2015-09-27 LAB — ANA: Anti Nuclear Antibody(ANA): NEGATIVE

## 2015-10-04 ENCOUNTER — Encounter: Payer: Self-pay | Admitting: Family Medicine

## 2015-11-11 ENCOUNTER — Encounter: Payer: Self-pay | Admitting: Family Medicine

## 2015-11-17 ENCOUNTER — Ambulatory Visit: Payer: BC Managed Care – PPO | Admitting: Family Medicine

## 2015-11-18 ENCOUNTER — Ambulatory Visit (INDEPENDENT_AMBULATORY_CARE_PROVIDER_SITE_OTHER): Payer: BC Managed Care – PPO

## 2015-11-18 ENCOUNTER — Ambulatory Visit: Payer: BC Managed Care – PPO

## 2015-11-18 ENCOUNTER — Ambulatory Visit (INDEPENDENT_AMBULATORY_CARE_PROVIDER_SITE_OTHER): Payer: BC Managed Care – PPO | Admitting: Family Medicine

## 2015-11-18 ENCOUNTER — Encounter: Payer: Self-pay | Admitting: Family Medicine

## 2015-11-18 VITALS — BP 102/65 | HR 64 | Temp 98.6°F | Resp 16 | Ht 64.0 in | Wt 110.6 lb

## 2015-11-18 DIAGNOSIS — M25511 Pain in right shoulder: Secondary | ICD-10-CM | POA: Diagnosis not present

## 2015-11-18 DIAGNOSIS — M79662 Pain in left lower leg: Secondary | ICD-10-CM

## 2015-11-18 DIAGNOSIS — R2240 Localized swelling, mass and lump, unspecified lower limb: Secondary | ICD-10-CM

## 2015-11-18 DIAGNOSIS — Z789 Other specified health status: Secondary | ICD-10-CM | POA: Diagnosis not present

## 2015-11-18 LAB — D-DIMER, QUANTITATIVE: D-Dimer, Quant: 0.27 ug/mL-FEU (ref 0.00–0.48)

## 2015-11-18 NOTE — Patient Instructions (Addendum)
     IF you received an x-ray today, you will receive an invoice from [facility]. Please contact [facility] at [phone] with questions or concerns regarding your invoice.   IF you received labwork today, you will receive an invoice from [employer]. Please contact [facility] at [phone] with questions or concerns regarding your invoice.   Our billing staff will not be able to assist you with questions regarding bills from these companies.  You will be contacted with the lab results as soon as they are available. The fastest way to get your results is to activate your My Chart account. Instructions are located on the last page of this paperwork. If you have not heard from [place] regarding the results in 2 weeks, please contact this office.    I will check the blood clot test, but if this is normal, would recommend MRI to rule out stress fracture of upper fibula. Avoid repetitive activities on that leg for now.   No concerning findings on xray of right leg.  If swollen area persists, or increases in size, can evaluate further in office.   I will refer you to Dr. Onnie Graham for your shoulder, but in the meantime avoid specific maneuvers that cause worse pain on that side.   Return to the clinic or go to the nearest emergency room if any of your symptoms worsen or new symptoms occur.

## 2015-11-18 NOTE — Progress Notes (Signed)
Subjective:  By signing my name below, I, [name], attest that this documentation has been prepared under the direction and in the presence of [name], MD. Electronically Signed: [name], [place]. 11/18/2015 , 4:44 PM .  Patient was seen in Room 26 .   Patient ID: Lori Hutchinson, female    DOB: [birth year], 40 y.o.   MRN: [id number] Chief Complaint  Patient presents with  . Shoulder Pain    right x 2 month  . Mass    right leg x 1 year  . Leg Pain    left x 3 weeks   HPI Lori Hutchinson is a 40 y.o. female Here for follow up of right shoulder pain, left leg pain and an abnormality over right leg. Initially plan to follow up for various arthralgias discussed in April as well as weight loss. She is an Publishing copy. She has a history of anorexia and bulimia, weight was down last visit. We discussed concerns of female athlete triad and phone numbers were provided for [employer]. She had a normal CMP, except borderline ALT of 35, borderline low of WBC but normal hemoglobin, normal TSH, negative ANA, normal rheum factor, and normal sed rate.   Wt Readings from Last 3 Encounters:  11/18/15 110 lb 9.6 oz (50.168 kg)  09/23/15 110 lb 12.8 oz (50.259 kg)  08/14/14 113 lb 3.2 oz (51.347 kg)   Her weight's unchanged today from visit in April. She denies meeting up with [employer]. She feels she eats enough. She isn't having periods at the moment.   Right Shoulder Pain Patient states right shoulder pain starting 2 months ago. She believes she hurt her shoulder during practice but isn't too sure; only possibility from handstand into a crunch with a forward roll. She had some pain prior to practice that day. She reports the pain has been improving. She's had pain in the area before, describing "like the collar bone is separating from her and like twisted rubber bands." She noticed hearing some crunching from her shoulder. She denies any radiating pain. 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   Left Leg Pain Patient also states having pain over the outside of lower left leg that started "out of the blue" when she was driving towards [place] 2-3 weeks ago. She's applied ice and heat over the area without any improvement. She also noticed some twitching in her left leg. She denies any swelling in the area. She denies history of blood clots. She denies smoking. She denies being on contraceptives. She denies taking supplements with hormones. She denies any recurrent fractures.   Mass on right leg She mentions having a mass growing on her right leg for about a year now. She denies any soreness or any pain. She does note a lot bruising in her right leg in the past. She denies any calf tenderness or calf swelling.   Patient Active Problem List   Diagnosis Date Noted  . Anemia 12/20/2011  . Borderline personality disorder 12/20/2011  . 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 PMS (premenstrual syndrome) 12/20/2011   Past Medical History  Diagnosis Date  . Asthma   . H/O varicella   . Yeast infection   . Bacterial infection   . Abnormal Pap smear 1996    Treatment cryosurgery  . H/O borderline personality disorder 12/23/10  .  H/O metrorrhagia 06/09/11   Past Surgical History  Procedure Laterality Date  . Wisdom tooth extraction     Allergies  Allergen Reactions  . Erythromycin   . Iodine   . Latex   . Percocet [Oxycodone-Acetaminophen]    Prior to Admission medications   Not on File   Social History   Social History  . Marital Status: Single    Spouse Name: N/A  . Number of Children: N/A  . Years of Education: N/A   Occupational History  . Not on file.   Social History Main Topics  . Smoking status: Former Smoker -- 13 years    Quit date: 06/18/2002  . Smokeless tobacco: Never Used  . Alcohol Use: No  . Drug Use:  No  . Sexual Activity:    Partners: Male    Birth Control/ Protection: Pill     Comment: lo loestrin   Other Topics Concern  . Not on file   Social History Narrative   Review of Systems  Constitutional: Negative for fever, chills and fatigue.  Cardiovascular: Negative for leg swelling.  Gastrointestinal: Negative for nausea and vomiting.  Musculoskeletal: Positive for myalgias and arthralgias. Negative for back pain, joint swelling and gait problem.  Neurological: Negative for dizziness, weakness and headaches.       Objective:   Physical Exam  Constitutional: She is oriented to person, place, and time. She appears well-developed and well-nourished. No distress.  HENT:  Head: Normocephalic and atraumatic.  Eyes: EOM are normal. Pupils are equal, round, and reactive to light.  Neck: Neck supple.  Cardiovascular: Normal rate.   Pulmonary/Chest: Effort normal. No respiratory distress.  Musculoskeletal: Normal range of motion.  Localized tenderness over proximal 3rd of fibula and peroneal tibial anterior, tibia non tender, pain free rom at left ankle including eversion 15 cm below patellar, circumference of left leg: 32cm  right leg: 32cm Right shoulder: no focal bony tenderness, skin intact, some crepitance of her right shoulder, full rotator cuff strength, full ROM, negative empty can, negative o'brien's, negative neer, negative hawkins, negative apprehension, negative cross over Slight soft tissue prominence over right medial tibia, non tender, no surrounding erythema  Neurological: She is alert and oriented to person, place, and time.  Skin: Skin is warm and dry.  Psychiatric: She has a normal mood and affect. Her behavior is normal.  Nursing note and vitals reviewed.   Filed Vitals:   11/18/15 1547  BP: 102/65  Pulse: 64  Temp: 98.6 F (37 C)  TempSrc: Oral  Resp: 16  Height: 5' 4"  (1.626 m)  Weight: 110 lb 9.6 oz (50.168 kg)  SpO2: 98%   Body mass index is 18.98  kg/(m^2).   Dg Shoulder Right  11/18/2015  CLINICAL DATA:  Right shoulder pain for 2 months, no injury EXAM: RIGHT SHOULDER - 2+ VIEW COMPARISON:  None. FINDINGS: The right humeral head is in normal position and the glenohumeral joint space appears normal. The right AC joint is normally aligned. No abnormality is seen. IMPRESSION: Negative. Electronically Signed   By: [name] M.D.   On: 11/18/2015 17:18   Dg Tibia/fibula Left  11/18/2015  CLINICAL DATA:  Pain in the left lower leg for 2 weeks, no known injury EXAM: LEFT TIBIA AND FIBULA - 2 VIEW COMPARISON:  None. FINDINGS: The left tibia and fibula are in normal position. No fracture is seen. No periosteal reaction is noted. No opaque foreign body is seen. IMPRESSION: Negative. Electronically Signed   By: Eddie Dibbles  Alvester Chou M.D.   On: 11/18/2015 17:19   Dg Tibia/fibula Right  11/18/2015  CLINICAL DATA:  Bowel upon the mid tibia for 1 year, no known trauma EXAM: RIGHT TIBIA AND FIBULA - 2 VIEW COMPARISON:  None. FINDINGS: The right tibia and fibula are in normal alignment. No fracture is seen. No periosteal reaction is noted. No opaque foreign body is seen. IMPRESSION: Negative. Electronically Signed   By: [name] M.D.   On: 11/18/2015 17:20        Assessment & Plan:   Lori Hutchinson is a 40 y.o. female Pain in left lower leg - Plan: DG Tibia/Fibula Left Localized swelling of lower leg - Plan: DG Tibia/Fibula Right Calf pain, left - Plan: D-dimer, quantitative (not at [facility]) History of recent travel - Plan: D-dimer, quantitative (not at [facility])  - Differential diagnosis of peroneal/tibialis anterior strain versus fibular stress fracture. Less likely DVT, but did have recent prolonged travel. D-dimer negative, decided against further ultrasound as lower risk. Recommended MRI to further differentiate possible stress fracture given her sport. She will let me know if this is okay once she is checked into cost and coverage. Avoidance of offending  activities for now.  Pain in joint of right shoulder - Plan: DG Shoulder Right, Ambulatory referral to Orthopedic Surgery  -Recurrent flares of right shoulder pain, most recent with more persistent pain. No concerning findings on x-ray. Exam suspicious for labral tear, but unable to reproduce exact location of pain on testing. She also could have some impingement.  -Refer to orthopedist, Dr. Onnie Graham for further evaluation and management options.   Meds ordered this encounter  Medications  . Glucosamine HCl (GLUCOSAMINE PO)    Sig: Take by mouth as needed.   Patient Instructions       IF you received an x-ray today, you will receive an invoice from [facility]. Please contact [facility]iology at [phone] with questions or concerns regarding your invoice.   IF you received labwork today, you will receive an invoice from [employer]. Please contact [facility] at [phone] with questions or concerns regarding your invoice.   Our billing staff will not be able to assist you with questions regarding bills from these companies.  You will be contacted with the lab results as soon as they are available. The fastest way to get your results is to activate your My Chart account. Instructions are located on the last page of this paperwork. If you have not heard from [place] regarding the results in 2 weeks, please contact this office.    I will check the blood clot test, but if this is normal, would recommend MRI to rule out stress fracture of upper fibula. Avoid repetitive activities on that leg for now.   No concerning findings on xray of right leg.  If swollen area persists, or increases in size, can evaluate further in office.   I will refer you to Dr. Onnie Graham for your shoulder, but in the meantime avoid specific maneuvers that cause worse pain on that side.   Return to the clinic or go to the nearest emergency room if any of your symptoms worsen or new  symptoms occur.     I personally performed the services described in this documentation, which was scribed in my presence. The recorded information has been reviewed and considered, and addended by me as needed.   Signed,   [name], MD [facility].  11/21/2015 4:22 PM

## 2015-11-19 ENCOUNTER — Telehealth: Payer: Self-pay | Admitting: Emergency Medicine

## 2015-11-19 NOTE — Telephone Encounter (Signed)
Pt given test results. Declined MRI testing due to cost Instructed to call if sx's worsens or if she have questions or concerns

## 2015-11-19 NOTE — Telephone Encounter (Signed)
-----   Message from [name], MD sent at 11/19/2015  2:25 PM EDT ----- Call patient.  D-dimer or test for blood clot was negative or normal. Next step would be an MRI of her lower leg. Let me know when she is ready, and we can arrange for this test.

## 2015-12-06 ENCOUNTER — Telehealth: Payer: Self-pay

## 2015-12-06 NOTE — Telephone Encounter (Signed)
Patient states that she is not going to go to orthopaedics due to cost.  She states her copay is $95 and she can not afford that.

## 2016-01-12 ENCOUNTER — Ambulatory Visit (INDEPENDENT_AMBULATORY_CARE_PROVIDER_SITE_OTHER): Payer: BC Managed Care – PPO

## 2016-01-12 ENCOUNTER — Encounter: Payer: Self-pay | Admitting: Family Medicine

## 2016-01-12 ENCOUNTER — Ambulatory Visit (INDEPENDENT_AMBULATORY_CARE_PROVIDER_SITE_OTHER): Payer: BC Managed Care – PPO | Admitting: Family Medicine

## 2016-01-12 VITALS — BP 110/72 | HR 63 | Temp 98.1°F | Resp 18 | Ht 64.0 in | Wt 111.0 lb

## 2016-01-12 DIAGNOSIS — M79662 Pain in left lower leg: Secondary | ICD-10-CM

## 2016-01-12 NOTE — Patient Instructions (Addendum)
  Call [facility] orthopedics to schedule appointment with Dr. Onnie Graham. You already had a referral there last visit, so they may not need another one. If they do need a new referral, let me know.  Ideally I would have you use crutches today, with no weightbearing on the left leg. Try to avoid as much activity with your left leg as possible until you're seen by orthopedics. If any worsening of her symptoms prior to evaluation with orthopedics, return here if needed. If you change your mind and would use crutches, return and I will have these ordered for you.   IF you received an x-ray today, you will receive an invoice from [facility]. Please contact [facility] Radiology at [phone] with questions or concerns regarding your invoice.   IF you received labwork today, you will receive an invoice from [employer]. Please contact [facility] at [phone] with questions or concerns regarding your invoice.   Our billing staff will not be able to assist you with questions regarding bills from these companies.  You will be contacted with the lab results as soon as they are available. The fastest way to get your results is to activate your My Chart account. Instructions are located on the last page of this paperwork. If you have not heard from [place] regarding the results in 2 weeks, please contact this office.

## 2016-01-12 NOTE — Progress Notes (Signed)
Subjective:  By signing my name below, I, Lori Hutchinson, attest that this documentation has been prepared under the direction and in the presence of [name], MD.  Electronically Signed: Thea Alken, ED Scribe. 01/12/2016. 4:49 PM.   Patient ID: Lori Hutchinson, female    DOB: 1976-03-14, 40 y.o.   MRN: [id number]  HPI Chief Complaint  Patient presents with   Leg Pain    Left lower leg.     HPI Comments: Lori Hutchinson is a 40 y.o. female who presents to the [facility] for a follow up of left lower leg pain. I last saw her 6/8. She aerialist. She noted the pain outside of left lower leg 2-3 weeks prior while driving. No hx of DVT and not on OCP's. She had an XR of the left tib-fib that was negative. Thought to have poss perineal or tib anterior strain versus fibular stress injury. She had a negative d-dimer. Recommended rest as well as MRI to rule out stress fx.  I have not received a call back regard authorizing MRI.   Pt has persistent aching left lower leg pain since last visit. Patient is able to localize/pin point pain and is cane feel a "bump" in this area. She initially found relief to pain with walking for 30 minutes, but states now when she walks she has worsening pain.  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 She does not want to go through with MRI due to cost.    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 Authorizing Provider  Glucosamine HCl (GLUCOSAMINE PO) Take by mouth as needed.    Historical Provider, MD   Social History   Social History   Marital status: Single    Spouse name: N/A   Number of children: N/A   Years of education: N/A   Occupational History   Not on file.   Social History Main Topics   Smoking status: Former Smoker    Years: 13.00    Quit date: 06/18/2002   Smokeless tobacco: Never Used   Alcohol use No   Drug use: No   Sexual activity: Yes    Partners: Male    Birth control/ protection: Pill     Comment: lo loestrin   Other Topics Concern   Not on file   Social History Narrative   No narrative on file   Review of Systems  Musculoskeletal: Positive for myalgias. Negative for gait problem.  Skin: Negative for color change and wound.  Neurological: Negative for weakness and numbness.       Objective:   Physical Exam  Constitutional: She is oriented to person,  place, and time. She appears well-developed and well-nourished. No distress.  HENT:  Head: Normocephalic.  Eyes: Conjunctivae are normal.  Neck: Neck supple.  Cardiovascular: Normal rate.   Pulmonary/Chest: Effort normal.  Musculoskeletal:  Focal tenderness along the lateral aspect of tibia. Tender 8 cm below inferior pole of patellar. Pain free ankle inversion, eversion, dorsi and plantar flexion.   Neurological: She is alert and oriented to person, place, and time.  Skin: Skin is warm and dry.  Psychiatric: She has a normal mood and affect. Her behavior is normal.  Nursing note and vitals reviewed.    Vitals:   01/12/16 1633  BP: 110/72  Pulse: 63  Resp: 18  Temp: 98.1 F (36.7 C)  TempSrc: Oral  SpO2: 99%  Weight: 111 lb (50.3 kg)  Height: 5' 4"  (1.626 m)    Wt Readings from Last 3 Encounters:  01/12/16 111 lb (50.3 kg)  11/18/15 110 lb 9.6 oz (50.2 kg)  09/23/15 110 lb 12.8 oz (50.3 kg)    Dg Tibia/fibula Left  Result Date: 01/12/2016 CLINICAL DATA:  Left proximal tibial pain for 3 months. EXAM: LEFT TIBIA AND FIBULA - 2 VIEW COMPARISON:  Radiographs of November 28, 2015. FINDINGS: There is no evidence of fracture or other focal bone lesions. Soft tissues are unremarkable. IMPRESSION: Normal left tibia and fibula. Electronically Signed   By: Marijo Conception, M.D.   On: 01/12/2016 17:23    Assessment & Plan:  Lori Hutchinson is a 40 y.o. female Pain in left lower leg - Plan: DG Tibia/Fibula Left Persistent pain anterior aspect of proximal left tibia, and just lateral. Does not appear to be in perineal musculature, no distal paresthesias. Occasionally aching throughout the lower leg, but pinpoint tender over the tibia. No sign of stress fracture seen on x-ray, but based on location and persistent symptoms, discussed stress injury still possible.  -  Discussed crutches and nonweightbearing, but this was declined. Did agree to follow-up with orthopedics, and has number from previous referral.  -Activity modification discussed, and if she changes her mind on crutches, can return to have these fitted  No orders of the defined types were placed in this encounter.  Patient Instructions    Call [facility] orthopedics to schedule appointment with Dr. Onnie Graham. You already had a referral there last visit, so they may not need another one. If they do need a new referral, let me know.  Ideally I would have you use crutches today, with no weightbearing on the left leg. Try to avoid as much activity with your left leg as possible until you're seen by orthopedics. If any worsening of her symptoms prior to evaluation with orthopedics, return here if needed. If you change your mind and would use crutches, return and I will have these ordered for you.   IF you  received an x-ray today, you will receive an invoice from [facility]. Please contact [facility] Radiology at [phone] with questions or concerns regarding your invoice.   IF you received labwork today, you will receive an invoice from [employer]. Please contact [facility] at [phone] with questions or concerns regarding your invoice.   Our billing staff will not be able to assist you with questions regarding bills from these companies.  You will be contacted with the lab results as soon as they are available. The fastest way to get your results is to activate your My Chart account. Instructions are located on the last page of this paperwork. If you  have not heard from [place] regarding the results in 2 weeks, please contact this office.        I personally performed the services described in this documentation, which was scribed in my presence. The recorded information has been reviewed and considered, and addended by me as needed.   Signed,   [name], MD [facility].  01/14/16 8:18 AM

## 2016-03-23 ENCOUNTER — Ambulatory Visit (INDEPENDENT_AMBULATORY_CARE_PROVIDER_SITE_OTHER): Payer: BC Managed Care – PPO | Admitting: Physician Assistant

## 2016-03-23 VITALS — BP 112/74 | HR 60 | Temp 98.1°F | Resp 17 | Ht 64.0 in | Wt 115.0 lb

## 2016-03-23 DIAGNOSIS — M6289 Other specified disorders of muscle: Secondary | ICD-10-CM | POA: Diagnosis not present

## 2016-03-23 MED ORDER — MELOXICAM 15 MG PO TABS: 15.0000 mg | ORAL_TABLET | Freq: Every day | ORAL | 1 refills | Status: DC

## 2016-03-23 NOTE — Patient Instructions (Addendum)
[facility]  [facility] in [place], [place] Address: [address], [place], [place] [address] Phone: [phone]  Use meloxicam daily for the next couple of weeks.  Use ice to affected area 4-5 x a day Follow up in one week if no improvement   Biceps Tendon Tendinitis (Distal) With Rehab Tendinitis involves inflammation and pain over the affected tendon. The distal biceps tendon (near the elbow) is vulnerable to tendinitis. Distal biceps tendonitis is usually due to the bony bump near the elbow (bicipital tuberosity) causing increased friction over the tendon. The biceps tendon attaches the biceps muscle to one bone in the elbow and two in the shoulder. It is important for proper function of the elbow and for turning the palm upward (supination). SYMPTOMS   Pain, aching, tenderness, and sometimes warmth or redness over the front of the elbow.  Pain when bending the elbow or turning the palm up, using the wrist, especially if performed against resistance.  Crackling sound (crepitation) when the tendon or elbow is moved or touched. CAUSES  The symptoms of biceps tendonitis are due to inflammation of the tendon. Inflammation may be caused by:  Strain from sudden increase in amount or intensity of activity.  Direct blow or injury to the elbow (uncommon).  Overuse or repetitive elbow bending or wrist rotation, particularly when turning the palm up, or with elbow hyperextension. RISK INCREASES WITH:  Sports that involve contact or overhead arm activity (throwing sports, gymnastics, weightlifting, bodybuilding, rock climbing).  Heavy labor.  Poor strength and flexibility.  Failure to warm up properly before activity.  Injury to other structures of the elbow.  Restraint of the elbow. PREVENTION  Warm up and stretch properly before activity.  Allow time for recovery between  activities.  Maintain physical fitness:  Strength, flexibility, and endurance.  Cardiovascular fitness.  Learn and use proper exercise technique. PROGNOSIS  With proper treatment, biceps tendon tendonitis is usually curable within 6 weeks.  RELATED COMPLICATIONS   Longer healing time if not properly treated or if not given enough time to heal.  Chronically inflamed tendon that causes persistent pain with activity, that may progress to constant pain and potentially rupture of the tendon.  Recurring symptoms, especially if activity is resumed too soon, with overuse or with poor technique. TREATMENT  Treatment first involves ice and medicine to reduce pain and inflammation. Modify activities that cause pain, to reduce the chances of causing the condition to get worse. Strengthening and stretching exercises should be performed to promote proper use of the muscles of the elbow. These exercise may be performed at home or with a therapist. Other treatments may be given such as ultrasound or heat therapy. Surgery is usually not recommended.  MEDICATION  If pain medicine is needed, nonsteroidal anti-inflammatory medicines (aspirin and ibuprofen), or other minor pain relievers (acetaminophen), are often advised.  Do not take pain relieving medication for 7 days before surgery.  Prescription pain relievers may be given if your caregiver thinks they are needed. Use only as directed and only as much as you need. HEAT AND COLD:   Cold treatment (icing) should be applied for 10 to 15 minutes every 2 to 3 hours for inflammation and pain, and immediately after activity that aggravates your symptoms. Use ice packs or an ice massage.  Heat treatment may be used before performing stretching and strengthening activities prescribed by your caregiver, physical therapist, or athletic trainer. Use a heat pack  or a warm water soak. SEEK MEDICAL CARE IF:   Symptoms get worse or do not improve in 2 weeks,  despite treatment.  New, unexplained symptoms develop. (Drugs used in treatment may produce side effects.) EXERCISES  RANGE OF MOTION (ROM) AND STRETCHING EXERCISES - Biceps Tendon Tendinitis (Distal) These exercises may help you when beginning to rehabilitate your injury. Your symptoms may go away with or without further involvement from your physician, physical therapist, or athletic trainer. While completing these exercises, remember:   Restoring tissue flexibility helps normal motion to return to the joints. This allows healthier, less painful movement and activity.  An effective stretch should be held for at least 30 seconds.  A stretch should never be painful. You should only feel a gentle lengthening or release in the stretched tissue. STRETCH - Elbow Flexors   Lie on a firm bed or countertop on your back. Be sure that you are in a comfortable position which will allow you to relax your arm muscles.  Place a folded towel under your right / left upper arm, so that your elbow and shoulder are at the same height. Extend your arm; your elbow should not rest on the bed or towel.  Allow the weight of your hand to straighten your elbow. Keep your arm and chest muscles relaxed. Your caretaker may ask you to increase the intensity of your stretch by adding a small wrist or hand weight.  Hold for __________ seconds. You should feel a stretch on the inside of your elbow. Slowly return to the starting position. Repeat __________ times. Complete this exercise __________ times per day. RANGE OF MOTION - Supination, Active   Stand or sit with your elbows at your side. Bend your right / left elbow to 90 degrees.  Turn your palm upward until you feel a gentle stretch on the inside of your forearm.  Hold this position for __________ seconds. Slowly release and return to the starting position. Repeat __________ times. Complete this stretch __________ times per day.  RANGE OF MOTION - Pronation,  Active   Stand or sit with your elbows at your side. Bend your right / left elbow to 90 degrees.  Turn your palm downward until you feel a gentle stretch on the top of your forearm.  Hold this position for __________ seconds. Slowly release and return to the starting position. Repeat __________ times. Complete this stretch __________ times per day.  STRENGTHENING EXERCISES - Biceps Tendon Tendinitis (Distal) These exercises may help you when beginning to rehabilitate your injury. They may resolve your symptoms with or without further involvement from your physician, physical therapist or athletic trainer. While completing these exercises, remember:   Muscles can gain both the endurance and the strength needed for everyday activities through controlled exercises.  Complete these exercises as instructed by your physician, physical therapist or athletic trainer. Increase the resistance and repetitions only as guided.  You may experience muscle soreness or fatigue, but the pain or discomfort you are trying to eliminate should never get worse during these exercises. If this pain does get worse, stop and make sure you are following the directions exactly. If the pain is still present after adjustments, discontinue the exercise until you can discuss the trouble with your clinician. STRENGTH - Elbow Flexors, Isometric   Stand or sit upright on a firm surface. Place your right / left arm so that your hand is palm-up and at the height of your waist.  Place your opposite hand on top  of your forearm. Gently push down as your right / left arm resists. Push as hard as you can with both arms without causing any pain or movement at your right / left elbow. Hold this stationary position for __________ seconds.  Gradually release the tension in both arms. Allow your muscles to relax completely before repeating. Repeat __________ times. Complete this exercise __________ times per day. STRENGTH - Forearm  Supinators   Sit with your right / left forearm supported on a table, keeping your elbow below shoulder height. Rest your hand over the edge, palm down.  Gently grip a hammer or a soup ladle.  Without moving your elbow, slowly turn your palm and hand upward to a "thumbs-up" position.  Hold this position for __________ seconds. Slowly return to the starting position. Repeat __________ times. Complete this exercise __________ times per day.  STRENGTH - Forearm Pronators   Sit with your right / left forearm supported on a table, keeping your elbow below shoulder height. Rest your hand over the edge, palm up.  Gently grip a hammer or a soup ladle.  Without moving your elbow, slowly turn your palm and hand upward to a "thumbs-up" position.  Hold this position for __________ seconds. Slowly return to the starting position. Repeat __________ times. Complete this exercise __________ times per day.  STRENGTH - Elbow Flexors, Supinated  With good posture, stand or sit on a firm chair without armrests. Allow your right / left arm to rest at your side with your palm facing forward.  Holding a __________ weight, or gripping a rubber exercise band or tubing, bring your hand toward your shoulder.  Allow your muscles to control the resistance as your hand returns to your side. Repeat __________ times. Complete this exercise __________ times per day.  STRENGTH - Elbow Flexors, Neutral  With good posture, stand or sit on a firm chair without armrests. Allow your right / left arm to rest at your side with your thumb facing forward.  Holding a __________ weight, or gripping a rubber exercise band or tubing, bring your hand toward your shoulder.  Allow your muscles to control the resistance as your hand returns to your side. Repeat __________ times. Complete this exercise __________ times per day.    This information is not intended to replace advice given to you by your health care provider. Make  sure you discuss any questions you have with your health care provider.   Document Released: 05/29/2005 Document Revised: 06/19/2014 Document Reviewed: 09/10/2008 [employer] Interactive Patient Education 2016 [employer]    Biceps Tendon Disruption (Proximal) With Rehab The biceps tendon attaches the biceps muscle to the bones of the elbow and the shoulder. A proximal biceps tendon disruption is a tear of the long head of the tendon that attaches near the shoulder (more common) or a tear in the muscle near the muscle tendon junction (less common). These injuries usually involve a complete tear of the tendon from the bone. However, partial tears are also possible. The biceps muscle works with other muscles to bend the elbow and rotate the palm upward (supinate). A complete biceps rupture will result in about a 10% decrease in elbow bending strength and a 20% decrease in your ability to turn the palm upward, using the wrist. SYMPTOMS   Pain, tenderness, swelling, warmth, or redness over the front of the shoulder.  Pain that gets worse with shoulder and elbow use, especially against resistance (lifting or carrying).  Bruising (contusion) in the arm or  elbow after 24 to 48 hours.  Bulge can be seen and felt in the arm.  Limited motion of the shoulder or elbow.  Weakness with attempted elbow bending or rotation of the wrist, such as when using a screwdriver.  Crackling sound (crepitation) when the tendon or shoulder is moved or touched. CAUSES  A biceps tendon rupture occurs when the tendon is subjected to a force that is greater than it can withstand. For example, a sudden force straightening the elbow while the biceps is flexed, or a direct hit (trauma) (rare). RISK INCREASES WITH:   Sports that involve contact, or throwing and overhead activities (racquet sports, gymnastics, weightlifting, bodybuilding).  Heavy labor.  Poor strength and flexibility.  Failure to warm up properly  before activity. PREVENTION  Warm up and stretch properly before activity.  Maintain physical fitness:  Strength, flexibility, and endurance.  Cardiovascular fitness.  Allow your body to recover between practices and competition.  Learn and use proper exercise technique. PROGNOSIS  If treated properly, the symptoms of a proximal biceps tendon disruption usually go away within 12 weeks of injury.  RELATED COMPLICATIONS  Longer healing time if not properly treated, or if not given enough time to heal.  Recurring symptoms, especially if activity is resumed too soon.  Weakness of elbow bending and forearm rotation.  Prolonged disability (uncommon). TREATMENT Treatment first involves the use of ice and medicine, to reduce pain and inflammation. It is also important to perform strengthening and stretching exercises and to modify activities that cause symptoms to get worse. These exercises may be performed at home or with a therapist. It is not possible to surgically fix the tendon (sew it together). However, surgery may be performed to reinsert the tendon into the arm bone. This will make the arm look normal again. Surgery is most often advised for younger, active individuals, especially those who require strength of wrist rotation.  MEDICATION  If pain medicine is needed, nonsteroidal anti-inflammatory medicines (aspirin and ibuprofen), or other minor pain relievers (acetaminophen), are often advised.  Do not take pain medicine for 7 days before surgery.  Prescription pain relievers may be given if your caregiver thinks they are needed. Use only as directed and only as much as you need.  Corticosteroid injections may be given to help reduce inflammation, but are not usually recommended for this injury. HEAT AND COLD  Cold treatment (icing) should be applied for 10 to 15 minutes every 2 to 3 hours for inflammation and pain, and immediately after activity that aggravates your symptoms.  Use ice packs or an ice massage.  Heat treatment may be used before performing stretching and strengthening activities prescribed by your caregiver, physical therapist, or athletic trainer. Use a heat pack or a warm water soak. SEEK MEDICAL CARE IF:   Symptoms get worse or do not improve in 2 weeks, despite treatment.  New, unexplained symptoms develop. (Drugs used in treatment may produce side effects.) EXERCISES RANGE OF MOTION (ROM) AND STRETCHING EXERCISES - Biceps Tendon Disruption (Proximal) These exercises may help you when beginning to rehabilitate your injury. Your symptoms may go away with or without further involvement from your physician, physical therapist or athletic trainer. While completing these exercises, remember:   Restoring tissue flexibility helps normal motion to return to the joints. This allows healthier, less painful movement and activity.  An effective stretch should be held for at least 30 seconds.  A stretch should never be painful. You should only feel a gentle lengthening or  release in the stretched tissue. STRETCH - Flexion, Standing  Stand with good posture. With an underhand grip on your right / left hand and an overhand grip on the opposite hand, grasp a broomstick or cane so that your hands are a little more than shoulder width apart.  Keeping your right / left elbow straight and shoulder muscles relaxed, push the stick with your opposite hand to raise your right / left arm in front of your body and then overhead. Raise your arm until you feel a stretch in your right / left shoulder, but before you have increased shoulder pain.  Try to avoid shrugging your right / left shoulder as your arm rises by keeping your shoulder blade tucked down and toward your mid-back spine. Hold __________ seconds.  Slowly return to the starting position. Repeat __________ times. Complete this exercise __________ times per day. STRETCH - Abduction, Supine  Stand with good  posture. With an underhand grip on your right / left hand and an overhand grip on the opposite hand, grasp a broomstick or cane so that your hands are a little more than shoulder-width apart.  Keeping your right / left elbow straight and shoulder muscles relaxed, push the stick with your opposite hand to raise your right / left arm out to the side of your body and then overhead. Raise your arm until you feel a stretch in your right / left shoulder, but before you have increased shoulder pain.  Try to avoid shrugging your right / left shoulder as your arm rises, by keeping your shoulder blade tucked down and toward your mid-back spine. Hold for __________ seconds.  Slowly return to the starting position. Repeat __________ times. Complete this exercise __________ times per day. ROM - Flexion, Active-Assisted  Lie on your back. You may bend your knees for comfort.  Grasp a broomstick or cane so your hands are about shoulder width apart. Your right / left hand should grip the end of the stick so that your hand is positioned "thumbs-up," as if you were about to shake hands.  Using your healthy arm to lead, raise your right / left arm overhead until you feel a gentle stretch in your shoulder. Hold for right / left seconds.  Use the stick to assist in returning your right / left arm to its starting position. Repeat __________ times. Complete this exercise __________ times per day.  STRETCH - Flexion, Standing   Stand facing a wall. Walk your right / left fingers up the wall until you feel a moderate stretch in your shoulder. As your hand gets higher, you may need to step closer to the wall or use a door frame to walk through.  Try to avoid shrugging your right / left shoulder as your arm rises, by keeping your shoulder blade tucked down and toward your mid-back spine.  Hold for __________ seconds. Use your other hand, if needed, to ease out of the stretch and return to the starting position. Repeat  __________ times. Complete this exercise __________ times per day.  ROM - Internal Rotation   Using underhand grips, grasp a stick behind your back with both hands.  While standing upright with good posture, slide the stick up your back until you feel a mild stretch in the front of your shoulder.  Hold for __________ seconds. Slowly return to your starting position. Repeat __________ times. Complete this exercise __________ times per day.  STRETCH - Internal Rotation  Place your right / left hand behind  your back, palm-up.  Throw a towel or belt over your opposite shoulder. Grasp the towel with your right / left hand.  While keeping an upright posture, gently pull up on the towel until you feel a stretch in the front of your right / left shoulder.  Avoid shrugging your right / left shoulder as your arm rises, by keeping your shoulder blade tucked down and toward your mid-back spine.  Hold for __________ seconds. Release the stretch by lowering your opposite hand. Repeat __________ times. Complete this exercise __________ times per day. STRETCH - Elbow Flexors   Lie on a firm bed or countertop on your back. Be sure that you are in a comfortable position which will allow you to relax your arm muscles.  Place a folded towel under your right / left upper arm, so that your elbow and shoulder are at the same height. Extend your arm; your elbow should not rest on the bed or towel.  Allow the weight of your hand to straighten your elbow. Keep your arm and chest muscles relaxed. Your caregiver may ask you to increase the intensity of your stretch by adding a small wrist or hand weight.  Hold for __________ seconds. You should feel a stretch on the inside of your elbow. Slowly return to the starting position. Repeat __________ times. Complete this exercise __________ times per day. STRENGTHENING EXERCISES - Biceps Tendon Disruption (Proximal) These exercises may help you regain your strength  after your physician has discontinued your restraint in a cast or brace. They may resolve your symptoms with or without further involvement from your physician, physical therapist or athletic trainer. While completing these exercises, remember:   Muscles can gain both the endurance and the strength needed for everyday activities through controlled exercises.  Complete these exercises as instructed by your physician, physical therapist or athletic trainer. Progress the resistance and repetitions only as guided.  You may experience muscle soreness or fatigue, but the pain or discomfort you are trying to eliminate should never worsen during these exercises. If this pain does get worse, stop and make sure you are following the directions exactly. If the pain is still present after adjustments, discontinue the exercise until you can discuss the trouble with your clinician. STRENGTH - Elbow Flexors, Isometric   Stand or sit upright on a firm surface. Place your right / left arm so that your hand is palm-up and at the height of your waist.  Place your opposite hand on top of your forearm. Gently push down as your right / left arm resists. Push as hard as you can with both arms, without causing any pain or movement at your right / left elbow. Hold this stationary position for __________ seconds.  Gradually release the tension in both arms. Allow your muscles to relax completely before repeating. Repeat __________ times. Complete this exercise __________ times per day. STRENGTH - Shoulder Flexion, Isometric  With good posture and facing a wall, stand or sit about 4-6 inches away.  Keeping your right / left elbow straight, gently press the top of your fist into the wall. Increase the pressure gradually until you are pressing as hard as you can without shrugging your shoulder or increasing any shoulder discomfort.  Hold for __________ seconds.  Release the tension slowly. Relax your shoulder muscles  completely before you the next repetition. Repeat __________ times. Complete this exercise __________ times per day.  STRENGTH - Elbow Flexors, Supinated  With good posture, stand or sit on a  firm chair without armrests. Allow your right / left arm to rest at your side with your palm facing forward.  Holding a __________weight or gripping a rubber exercise band or tubing, bring your hand toward your shoulder.  Allow your muscles to control the resistance as your hand returns to your side. Repeat __________ times. Complete this exercise __________ times per day.  STRENGTH - Elbow Flexors, Neutral  With good posture, stand or sit on a firm chair without armrests. Allow your right / left arm to rest at your side with your thumb facing forward.  Holding a __________ weight or gripping a rubber exercise band or tubing, bring your hand toward your shoulder.  Allow your muscles to control the resistance as your hand returns to your side. Repeat __________ times. Complete this exercise __________ times per day.  STRENGTH - Shoulder Flexion   Stand or sit with good posture. Grasp a __________ weight, or an exercise band or tubing, so that your hand is "thumbs-up," like when you shake hands.  Slowly lift your right / left arm as far as you can without increasing any shoulder pain. Initially, many people can only raise their hand to shoulder height.  Avoid shrugging your right / left shoulder as your arm rises, by keeping your shoulder blade tucked down and toward your mid-back spine.  Hold for __________ seconds. Control the descent of your hand as you slowly return to your starting position. Repeat __________ times. Complete this exercise __________ times per day.  STRENGTH - Forearm Supinators   Sit with your right / left forearm supported on a table, keeping your elbow below shoulder height. Rest your hand over the edge, palm down.  Gently grip a hammer or a soup ladle.  Without moving  your elbow, slowly turn your palm and hand upward to a "thumbs-up" position.  Hold this position for __________ seconds. Slowly return to the starting position. Repeat __________ times. Complete this exercise __________ times per day.  STRENGTH - Forearm Pronators   Sit with your right / left forearm supported on a table, keeping your elbow below shoulder height. Rest your hand over the edge, palm up.  Gently grip a hammer or a soup ladle.  Without moving your elbow, slowly turn your palm and hand upward to a "thumbs-up" position.  Hold this position for __________ seconds. Slowly return to the starting position. Repeat __________ times. Complete this exercise __________ times per day.    This information is not intended to replace advice given to you by your health care provider. Make sure you discuss any questions you have with your health care provider.   Document Released: 05/29/2005 Document Revised: 06/19/2014 Document Reviewed: 09/10/2008 [employer] Interactive Patient Education 2016 [employer] [place].    IF you received an x-ray today, you will receive an invoice from [facility]. Please contact [facility] at [phone] with questions or concerns regarding your invoice.   IF you received labwork today, you will receive an invoice from [employer]. Please contact [facility] at [phone] with questions or concerns regarding your invoice.   Our billing staff will not be able to assist you with questions regarding bills from these companies.  You will be contacted with the lab results as soon as they are available. The fastest way to get your results is to activate your My Chart account. Instructions are located on the last page of this paperwork. If you have not heard from [place] regarding the results in 2 weeks, please  contact this office.

## 2016-03-23 NOTE — Progress Notes (Signed)
Lori Hutchinson  MRN: [id number] DOB: 07/08/1975  Subjective:  Lori Hutchinson is a 40 y.o. female seen in office today for a chief complaint of right arm injury x 1 day. Pt went to crossfit yesterday and was doing a lot of pull ups. Noticed after that her right arm would not straigthen out. Has assoicated stiffness and swelling in biceps and right hand. Denies hearing/feeling a popping sensation, pain, numbness, tingling, and loss of sensation. Has only been doing crossfit for a couple months. Pt did stretch before exercising yesterday. Has tried ibuprofen and ice and heat with moderate relief.   Of note, pt was an Arielist prior to starting cross fit and used to stretch all the time. Now she does minimal stretching since she has started solely doing cross fit. States her whole body feels more tight than it has in the past. She has also not been drinking much water during the day. States she only had 1-2 glasses yesterday despite engaging in intense work out.   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   Review of Systems  Per HPI  Patient Active Problem List   Diagnosis Date Noted  . Anemia 12/20/2011  . Borderline personality disorder 12/20/2011  . 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 PMS (premenstrual syndrome) 12/20/2011    Current Outpatient Prescriptions on File Prior to Visit  Medication Sig Dispense Refill  . Glucosamine HCl (GLUCOSAMINE PO) Take by mouth as needed.     No current facility-administered medications on file prior to visit.     Allergies  Allergen Reactions  . Erythromycin   . Iodine   . Latex   . Percocet [Oxycodone-Acetaminophen]     Objective:  BP 112/74 (BP Location: Right Arm, Patient Position: Sitting, Cuff Size: Normal)   Pulse 60   Temp 98.1 F (36.7 C) (Oral)   Resp 17   Ht 5'  4" (1.626 m)   Wt 115 lb (52.2 kg)   SpO2 100%   BMI 19.74 kg/m   Physical Exam  Constitutional: She is oriented to person, place, and time and well-developed, well-nourished, and in no distress.  HENT:  Head: Normocephalic and atraumatic.  Eyes: Conjunctivae are normal.  Neck: Normal range of motion.  Pulmonary/Chest: Effort normal.  Musculoskeletal:       Cervical back: She exhibits spasm (right muscle ).       Right upper arm: She exhibits tenderness (to palpation of distal and proximal aspect of biceps). She exhibits no bony tenderness, no swelling and no deformity.       Left upper arm: Normal.  Full passive ROM with both extension and flexion, normal flexion with active ROM, decreased extension with active ROM.   Neurological: She is alert and oriented to person, place, and time. Gait normal.  Reflex Scores:      Tricep reflexes are 2+ on the right side and 2+ on the left side.      Bicep reflexes are 2+ on the right side and 2+ on the left side.      Brachioradialis reflexes are 2+ on the right side and 2+ on the left side. Skin: Skin is warm and dry.  Psychiatric: Affect normal.  Vitals reviewed.   Assessment and Plan :  1. Muscle tightness -Pt instructed on the importance of stretching before activity, especially high  intensity activities like cross fit. -Instructed to use ice to affected area 4-5 times a day, use heat on the spasms, a massage may also benefit pt  -Educational material for upper arm stretches given to pt -Hydrate with at least 64 oz of water daily -Also instructed to visit [place] for sports med focused evaluation - meloxicam (MOBIC) 15 MG tablet; Take 1 tablet (15 mg total) by mouth daily.  Dispense: 30 tablet; Refill: 1 -Follow up in one week if no improvement.  Tenna Delaine PA-C  [facility] 03/23/2016 1:45 PM

## 2016-03-25 ENCOUNTER — Inpatient Hospital Stay (HOSPITAL_COMMUNITY)
Admission: EM | Admit: 2016-03-25 | Discharge: 2016-03-29 | DRG: 558 | Disposition: A | Discharge status: Home / Self Care | Payer: BC Managed Care – PPO | Attending: Internal Medicine | Admitting: Internal Medicine

## 2016-03-25 ENCOUNTER — Encounter (HOSPITAL_COMMUNITY): Payer: Self-pay

## 2016-03-25 DIAGNOSIS — M6282 Rhabdomyolysis: Secondary | ICD-10-CM | POA: Diagnosis not present

## 2016-03-25 DIAGNOSIS — Z809 Family history of malignant neoplasm, unspecified: Secondary | ICD-10-CM

## 2016-03-25 DIAGNOSIS — R636 Underweight: Secondary | ICD-10-CM | POA: Diagnosis present

## 2016-03-25 DIAGNOSIS — Z681 Body mass index (BMI) 19 or less, adult: Secondary | ICD-10-CM

## 2016-03-25 DIAGNOSIS — Z91040 Latex allergy status: Secondary | ICD-10-CM

## 2016-03-25 DIAGNOSIS — R001 Bradycardia, unspecified: Secondary | ICD-10-CM | POA: Diagnosis present

## 2016-03-25 DIAGNOSIS — Z885 Allergy status to narcotic agent status: Secondary | ICD-10-CM

## 2016-03-25 DIAGNOSIS — Z8659 Personal history of other mental and behavioral disorders: Secondary | ICD-10-CM

## 2016-03-25 DIAGNOSIS — T796XXA Traumatic ischemia of muscle, initial encounter: Secondary | ICD-10-CM | POA: Diagnosis not present

## 2016-03-25 DIAGNOSIS — J45909 Unspecified asthma, uncomplicated: Secondary | ICD-10-CM | POA: Diagnosis present

## 2016-03-25 DIAGNOSIS — Z87891 Personal history of nicotine dependence: Secondary | ICD-10-CM

## 2016-03-25 DIAGNOSIS — Z8249 Family history of ischemic heart disease and other diseases of the circulatory system: Secondary | ICD-10-CM

## 2016-03-25 DIAGNOSIS — Z9889 Other specified postprocedural states: Secondary | ICD-10-CM

## 2016-03-25 DIAGNOSIS — E878 Other disorders of electrolyte and fluid balance, not elsewhere classified: Secondary | ICD-10-CM | POA: Diagnosis present

## 2016-03-25 DIAGNOSIS — Z79899 Other long term (current) drug therapy: Secondary | ICD-10-CM

## 2016-03-25 DIAGNOSIS — Z888 Allergy status to other drugs, medicaments and biological substances status: Secondary | ICD-10-CM

## 2016-03-25 DIAGNOSIS — Z825 Family history of asthma and other chronic lower respiratory diseases: Secondary | ICD-10-CM

## 2016-03-25 LAB — CBC WITH DIFFERENTIAL/PLATELET
BASOS PCT: 0 %
Basophils Absolute: 0 10*3/uL (ref 0.0–0.1)
EOS ABS: 0.1 10*3/uL (ref 0.0–0.7)
Eosinophils Relative: 2 %
HEMATOCRIT: 38.9 % (ref 36.0–46.0)
Hemoglobin: 13.3 g/dL (ref 12.0–15.0)
Lymphocytes Relative: 26 %
Lymphs Abs: 1.3 10*3/uL (ref 0.7–4.0)
MCH: 32.8 pg (ref 26.0–34.0)
MCHC: 34.2 g/dL (ref 30.0–36.0)
MCV: 96 fL (ref 78.0–100.0)
MONO ABS: 0.5 10*3/uL (ref 0.1–1.0)
Monocytes Relative: 10 %
NEUTROS ABS: 3.1 10*3/uL (ref 1.7–7.7)
NEUTROS PCT: 62 %
Platelets: 224 10*3/uL (ref 150–400)
RBC: 4.05 MIL/uL (ref 3.87–5.11)
RDW: 12.4 % (ref 11.5–15.5)
WBC: 5.1 10*3/uL (ref 4.0–10.5)

## 2016-03-25 LAB — BASIC METABOLIC PANEL
ANION GAP: 6 (ref 5–15)
BUN: 25 mg/dL — ABNORMAL HIGH (ref 6–20)
CALCIUM: 9.4 mg/dL (ref 8.9–10.3)
CO2: 23 mmol/L (ref 22–32)
Chloride: 110 mmol/L (ref 101–111)
Creatinine, Ser: 0.82 mg/dL (ref 0.44–1.00)
Glucose, Bld: 76 mg/dL (ref 65–99)
POTASSIUM: 4 mmol/L (ref 3.5–5.1)
Sodium: 139 mmol/L (ref 135–145)

## 2016-03-25 LAB — PREGNANCY, URINE: Preg Test, Ur: NEGATIVE

## 2016-03-25 LAB — URINALYSIS, ROUTINE W REFLEX MICROSCOPIC
BILIRUBIN URINE: NEGATIVE
Glucose, UA: NEGATIVE mg/dL
Hgb urine dipstick: NEGATIVE
KETONES UR: NEGATIVE mg/dL
NITRITE: NEGATIVE
PH: 6.5 (ref 5.0–8.0)
Protein, ur: NEGATIVE mg/dL
Specific Gravity, Urine: 1.011 (ref 1.005–1.030)

## 2016-03-25 LAB — URINE MICROSCOPIC-ADD ON

## 2016-03-25 MED ORDER — SODIUM CHLORIDE 0.9 % IV BOLUS (SEPSIS)
1000.0000 mL | Freq: Once | INTRAVENOUS | Status: AC
Start: 2016-03-25 — End: 2016-03-25
  Administered 2016-03-25: 1000 mL via INTRAVENOUS

## 2016-03-25 MED ORDER — SODIUM CHLORIDE 0.9 % IV BOLUS (SEPSIS)
1000.0000 mL | Freq: Once | INTRAVENOUS | Status: AC
  Administered 2016-03-25: 1000 mL via INTRAVENOUS

## 2016-03-25 NOTE — ED Triage Notes (Signed)
Patient c/o arm pain after working out in the gym on Wednesday.  Patient states is unable to straighten her left or right arm and states that she gained 3lbs overnight.  Patient went to PCP and the prescribed her Meloxicam for the pain but, states she would like to be check for Rhabdomyolysis.

## 2016-03-25 NOTE — ED Provider Notes (Signed)
[facility] DEPT Provider Note   CSN: [id number] Arrival date & time: 03/25/16  2010     History   Chief Complaint Chief Complaint  Patient presents with  . Arm Pain    HPI Lori Hutchinson is a 40 y.o. female.  Pt presents to the ED today with severe pain in her biceps.  She said that she did a cross fit work out on Wednesday, 10/11.  The pt said that cross fit is new for her.  She said that it hurts to stretch out her arms.  The pt went to urgent care on the 12th and was put on meloxicam which has not helped.  She saw ortho yesterday who told her that she might have a "little rhabdo."  Pt said she feels worse today, and does not feel like she is improving.      Past Medical History:  Diagnosis Date  . Abnormal Pap smear 1996   Treatment cryosurgery  . Asthma   . Bacterial infection   . H/O borderline personality disorder 12/23/10  . H/O metrorrhagia 06/09/11  . H/O varicella   . Yeast infection     Patient Active Problem List   Diagnosis Date Noted  . Anemia 12/20/2011  . Borderline personality disorder 12/20/2011  . 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 PMS (premenstrual syndrome) 12/20/2011    Past Surgical History:  Procedure Laterality Date  . WISDOM TOOTH EXTRACTION      OB History    Gravida Para Term Preterm AB Living   0 0 0 0 0 0   SAB TAB Ectopic Multiple Live Births   0 0 0 0         Home Medications    Prior to Admission medications   Medication Sig Start Date End Date Taking? Authorizing Provider  ibuprofen (ADVIL,MOTRIN) 100 MG tablet Take 200-400 mg by mouth every 6 (six) hours as needed for pain.    Yes Historical Provider, MD  meloxicam (MOBIC) 15 MG tablet Take 1 tablet (15 mg total) by mouth daily. 03/23/16  Yes Leonie Douglas, PA-C  PROAIR HFA 108 [phone] Base) MCG/ACT inhaler Inhale 2 puffs into the lungs every 4 (four) hours as needed for wheezing or shortness of breath.  02/24/16  Yes Historical Provider, MD    Family  History Family History  Problem Relation Age of Onset  . Cancer Maternal Grandmother     Ovarian  . Asthma Mother   . Heart disease Father   . Cancer Father     Lung  . Cancer Paternal Grandmother     Colon  . Cancer Paternal Grandfather     Colon    Social History Social History  Substance Use Topics  . Smoking status: Former Smoker    Years: 13.00    Quit date: 06/18/2002  . Smokeless tobacco: Never Used  . Alcohol use No     Allergies   Erythromycin; Percocet [oxycodone-acetaminophen]; Shellfish allergy; Iodine; and Latex   Review of Systems Review of Systems  Musculoskeletal:       Bilateral bicep pain  All other systems reviewed and are negative.    Physical Exam Updated Vital Signs BP 106/75 (BP Location: Right Wrist)   Pulse 79   Temp 98.2 F (36.8 C) (Oral)   Resp 16   Ht 5' 4"  (1.626 m)   Wt 92 lb 4 oz (41.8 kg)   LMP 10/10/2012 Comment: States that they just stopped.  SpO2 97%   BMI 15.83  kg/m   Physical Exam  Constitutional: She appears well-developed and well-nourished.  HENT:  Head: Normocephalic and atraumatic.  Right Ear: External ear normal.  Left Ear: External ear normal.  Nose: Nose normal.  Mouth/Throat: Oropharynx is clear and moist.  Eyes: Conjunctivae and EOM are normal. Pupils are equal, round, and reactive to light.  Neck: Normal range of motion. Neck supple.  Cardiovascular: Normal rate, regular rhythm, normal heart sounds and intact distal pulses.   Pulmonary/Chest: Effort normal and breath sounds normal.  Abdominal: Soft. Bowel sounds are normal.  Musculoskeletal:  Bilateral bicep tenderness (right greater than left)  Neurological: She is alert.  Skin: Skin is warm.  Psychiatric: She has a normal mood and affect. Her behavior is normal. Thought content normal.  Nursing note and vitals reviewed.    ED Treatments / Results  Labs (all labs ordered are listed, but only abnormal results are displayed) Labs Reviewed    BASIC METABOLIC PANEL - Abnormal; Notable for the following:       Result Value   BUN 25 (*)    All other components within normal limits  URINALYSIS, ROUTINE W REFLEX MICROSCOPIC (NOT AT [facility]) - Abnormal; Notable for the following:    Leukocytes, UA TRACE (*)    All other components within normal limits  CK - Abnormal; Notable for the following:    Total CK 15,702 (*)    All other components within normal limits  URINE MICROSCOPIC-ADD ON - Abnormal; Notable for the following:    Squamous Epithelial / LPF 0-5 (*)    Bacteria, UA RARE (*)    All other components within normal limits  CBC WITH DIFFERENTIAL/PLATELET  PREGNANCY, URINE  MYOGLOBIN, SERUM    EKG  EKG Interpretation None       Radiology No results found.  Procedures Procedures (including critical care time)  Medications Ordered in ED Medications  sodium chloride 0.9 % bolus 1,000 mL (not administered)  sodium bicarbonate 150 mEq in sterile water 1,000 mL infusion (not administered)  sodium chloride 0.9 % bolus 1,000 mL (0 mLs Intravenous Stopped 03/25/16 2358)  sodium chloride 0.9 % bolus 1,000 mL (1,000 mLs Intravenous New Bag/Given 03/25/16 2359)     Initial Impression / Assessment and Plan / ED Course  I have reviewed the triage vital signs and the nursing notes.  Pertinent labs & imaging results that were available during my care of the patient were reviewed by me and considered in my medical decision making (see chart for details).  Clinical Course    Pt with rhabdo from cross-fit.  Pt given IVFs and she will also be started on a bicarbonate drip.  Pt to be d/w hospitalists for admission.  Dr. Loleta Books will admit.  Final Clinical Impressions(s) / ED Diagnoses   Final diagnoses:  Traumatic rhabdomyolysis, initial encounter [facility])    New Prescriptions New Prescriptions   No medications on file     Isla Pence, MD 03/26/16 2151

## 2016-03-25 NOTE — ED Notes (Signed)
Bed: WA16 Expected date:  Expected time:  Means of arrival:  Comments: 

## 2016-03-26 ENCOUNTER — Encounter (HOSPITAL_COMMUNITY): Payer: Self-pay | Admitting: Family Medicine

## 2016-03-26 DIAGNOSIS — Z91040 Latex allergy status: Secondary | ICD-10-CM | POA: Diagnosis not present

## 2016-03-26 DIAGNOSIS — J45909 Unspecified asthma, uncomplicated: Secondary | ICD-10-CM | POA: Diagnosis present

## 2016-03-26 DIAGNOSIS — Z825 Family history of asthma and other chronic lower respiratory diseases: Secondary | ICD-10-CM | POA: Diagnosis not present

## 2016-03-26 DIAGNOSIS — Z885 Allergy status to narcotic agent status: Secondary | ICD-10-CM | POA: Diagnosis not present

## 2016-03-26 DIAGNOSIS — Z8659 Personal history of other mental and behavioral disorders: Secondary | ICD-10-CM | POA: Diagnosis not present

## 2016-03-26 DIAGNOSIS — Z79899 Other long term (current) drug therapy: Secondary | ICD-10-CM | POA: Diagnosis not present

## 2016-03-26 DIAGNOSIS — T796XXA Traumatic ischemia of muscle, initial encounter: Secondary | ICD-10-CM | POA: Diagnosis present

## 2016-03-26 DIAGNOSIS — M6282 Rhabdomyolysis: Secondary | ICD-10-CM | POA: Diagnosis present

## 2016-03-26 DIAGNOSIS — Z681 Body mass index (BMI) 19 or less, adult: Secondary | ICD-10-CM | POA: Diagnosis not present

## 2016-03-26 DIAGNOSIS — Z888 Allergy status to other drugs, medicaments and biological substances status: Secondary | ICD-10-CM | POA: Diagnosis not present

## 2016-03-26 DIAGNOSIS — E878 Other disorders of electrolyte and fluid balance, not elsewhere classified: Secondary | ICD-10-CM | POA: Diagnosis present

## 2016-03-26 DIAGNOSIS — Z87891 Personal history of nicotine dependence: Secondary | ICD-10-CM | POA: Diagnosis not present

## 2016-03-26 DIAGNOSIS — R001 Bradycardia, unspecified: Secondary | ICD-10-CM | POA: Diagnosis not present

## 2016-03-26 DIAGNOSIS — Z809 Family history of malignant neoplasm, unspecified: Secondary | ICD-10-CM | POA: Diagnosis not present

## 2016-03-26 DIAGNOSIS — Z8249 Family history of ischemic heart disease and other diseases of the circulatory system: Secondary | ICD-10-CM | POA: Diagnosis not present

## 2016-03-26 DIAGNOSIS — Z9889 Other specified postprocedural states: Secondary | ICD-10-CM | POA: Diagnosis not present

## 2016-03-26 DIAGNOSIS — R636 Underweight: Secondary | ICD-10-CM | POA: Diagnosis present

## 2016-03-26 LAB — RAPID URINE DRUG SCREEN, HOSP PERFORMED
Amphetamines: NOT DETECTED
BENZODIAZEPINES: NOT DETECTED
Barbiturates: NOT DETECTED
Cocaine: NOT DETECTED
Opiates: NOT DETECTED
Tetrahydrocannabinol: NOT DETECTED

## 2016-03-26 LAB — URINALYSIS, ROUTINE W REFLEX MICROSCOPIC
Bilirubin Urine: NEGATIVE
Glucose, UA: NEGATIVE mg/dL
HGB URINE DIPSTICK: NEGATIVE
Ketones, ur: NEGATIVE mg/dL
LEUKOCYTES UA: NEGATIVE
Nitrite: NEGATIVE
PROTEIN: NEGATIVE mg/dL
Specific Gravity, Urine: 1.005 (ref 1.005–1.030)
pH: 8 (ref 5.0–8.0)

## 2016-03-26 LAB — BASIC METABOLIC PANEL
ANION GAP: 5 (ref 5–15)
BUN: 17 mg/dL (ref 6–20)
CO2: 26 mmol/L (ref 22–32)
Calcium: 8.7 mg/dL — ABNORMAL LOW (ref 8.9–10.3)
Chloride: 110 mmol/L (ref 101–111)
Creatinine, Ser: 0.79 mg/dL (ref 0.44–1.00)
Glucose, Bld: 103 mg/dL — ABNORMAL HIGH (ref 65–99)
POTASSIUM: 3.7 mmol/L (ref 3.5–5.1)
SODIUM: 141 mmol/L (ref 135–145)

## 2016-03-26 LAB — CK
CK TOTAL: 16994 U/L — AB (ref 38–234)
Total CK: 15702 U/L — ABNORMAL HIGH (ref 38–234)

## 2016-03-26 MED ORDER — ONDANSETRON HCL 4 MG/2ML IJ SOLN: 4.0000 mg | Freq: Four times a day (QID) | INTRAMUSCULAR | Status: DC | PRN

## 2016-03-26 MED ORDER — ENOXAPARIN SODIUM 30 MG/0.3ML ~~LOC~~ SOLN
30.0000 mg | SUBCUTANEOUS | Status: DC
  Filled 2016-03-26 (×2): qty 0.3

## 2016-03-26 MED ORDER — SODIUM CHLORIDE 0.9 % IV SOLN
INTRAVENOUS | Status: DC
  Administered 2016-03-26 – 2016-03-27 (×3): via INTRAVENOUS

## 2016-03-26 MED ORDER — ACETAMINOPHEN 325 MG PO TABS: 650.0000 mg | ORAL_TABLET | Freq: Four times a day (QID) | ORAL | Status: DC | PRN

## 2016-03-26 MED ORDER — STERILE WATER FOR INJECTION IV SOLN
INTRAVENOUS | Status: DC
  Administered 2016-03-26 (×2): via INTRAVENOUS
  Filled 2016-03-26 (×3): qty 850

## 2016-03-26 MED ORDER — ONDANSETRON HCL 4 MG PO TABS: 4.0000 mg | ORAL_TABLET | Freq: Four times a day (QID) | ORAL | Status: DC | PRN

## 2016-03-26 MED ORDER — HYDROCODONE-ACETAMINOPHEN 5-325 MG PO TABS: 1.0000 | ORAL_TABLET | ORAL | Status: DC | PRN

## 2016-03-26 MED ORDER — ACETAMINOPHEN 650 MG RE SUPP: 650.0000 mg | Freq: Four times a day (QID) | RECTAL | Status: DC | PRN

## 2016-03-26 MED ORDER — SODIUM CHLORIDE 0.9 % IV BOLUS (SEPSIS)
1000.0000 mL | Freq: Once | INTRAVENOUS | Status: AC
  Administered 2016-03-26: 1000 mL via INTRAVENOUS

## 2016-03-26 NOTE — Progress Notes (Signed)
PROGRESS NOTE    Lori Hutchinson  KYH:[id number] DOB: [birth year] DOA: 03/25/2016 PCP: Yvonne Kendall, CNM (Inactive)   Brief Narrative: 40 year old female with history of anorexia nervosa with bulimia, asthma presented with arm pain and swelling found to have rhabdomyolysis.  Assessment & Plan:   #  Non-traumatic rhabdomyolysis: In the setting of exercise at fitness center. Patient reported taking energy drinks and supplemental proteins. Also reported dehydration, not sleeping enough and exertion recently. Exactly unknown if energy drinks might have contributed rhabdomyolysis. There are a few case reports about this. She has normal kidney function. Continue IV fluid. Monitor CK level. Provide supportive care.  -Monitor electrolytes  #History of anorexia nervosa with bulimia: Outpatient follow-up.  #Symptomatically sinus bradycardia likely normal heart rate in this patient who does regular exercise. Continue to monitor. I will check EKG.  DVT prophylaxis: Lovenox subcutaneous Code Status: Full code Family Communication: No family present at bedside Disposition Plan: Likely discharge home in 1-2 days   Consultants:   None  Procedures: None Antimicrobials: None  Subjective: Patient was seen and examined at bedside. Patient reported muscle soreness on upper extremities. Denied fever, chills, nausea, vomiting, chest pain, shortness of breath, abdominal pain. Denies urinary symptoms. No diarrhea or constipation or dysuria or urgency.   Objective: Vitals:   03/25/16 2230 03/26/16 0030 03/26/16 0210 03/26/16 0555  BP: 102/73 106/75 (!) 101/59 103/62  Pulse: (!) 58 79 (!) 51 (!) 59  Resp: 16 16 16 16   Temp:  98.2 F (36.8 C) 98 F (36.7 C) 97.8 F (36.6 C)  TempSrc:  Oral Oral Oral  SpO2: 100% 97% 100% 99%  Weight:      Height:        Intake/Output Summary (Last 24 hours) at 03/26/16 1239 Last data filed at 03/26/16 1141  Gross per 24 hour  Intake           640.83 ml    Output             2001 ml  Net         -1360.17 ml   Filed Weights   03/25/16 2013  Weight: 41.8 kg (92 lb 4 oz)    Examination:  General exam: Appears calm and comfortable  Respiratory system: Clear to auscultation. Respiratory effort normal. Cardiovascular system: S1 & S2 heard, RRR. No pedal edema. Gastrointestinal system: Abdomen is nondistended, soft and nontender. Normal bowel sounds heard. Central nervous system: Alert and oriented. No focal neurological deficits. Extremities: Symmetric 5 x 5 power. Skin: No rashes, lesions or ulcers Psychiatry: Judgement and insight appear normal. Mood & affect appropriate.     Data Reviewed: I have personally reviewed following labs and imaging studies  CBC:  Recent Labs Lab 03/25/16 2254  WBC 5.1  NEUTROABS 3.1  HGB 13.3  HCT 38.9  MCV 96.0  PLT 151   Basic Metabolic Panel:  Recent Labs Lab 03/25/16 2254 03/26/16 1122  NA 139 141  K 4.0 3.7  CL 110 110  CO2 23 26  GLUCOSE 76 103*  BUN 25* 17  CREATININE 0.82 0.79  CALCIUM 9.4 8.7*   GFR: Estimated Creatinine Clearance: 62.3 mL/min (by C-G formula based on SCr of 0.79 mg/dL). Liver Function Tests: No results for input(s): AST, ALT, ALKPHOS, BILITOT, PROT, ALBUMIN in the last 168 hours. No results for input(s): LIPASE, AMYLASE in the last 168 hours. No results for input(s): AMMONIA in the last 168 hours. Coagulation Profile: No results for input(s): INR, PROTIME in the last  168 hours. Cardiac Enzymes:  Recent Labs Lab 03/25/16 2312  CKTOTAL 15,702*   BNP (last 3 results) No results for input(s): PROBNP in the last 8760 hours. HbA1C: No results for input(s): HGBA1C in the last 72 hours. CBG: No results for input(s): GLUCAP in the last 168 hours. Lipid Profile: No results for input(s): CHOL, HDL, LDLCALC, TRIG, CHOLHDL, LDLDIRECT in the last 72 hours. Thyroid Function Tests: No results for input(s): TSH, T4TOTAL, FREET4, T3FREE, THYROIDAB in the last  72 hours. Anemia Panel: No results for input(s): VITAMINB12, FOLATE, FERRITIN, TIBC, IRON, RETICCTPCT in the last 72 hours. Sepsis Labs: No results for input(s): PROCALCITON, LATICACIDVEN in the last 168 hours.  No results found for this or any previous visit (from the past 240 hour(s)).       Radiology Studies: No results found.      Scheduled Meds: . enoxaparin (LOVENOX) injection  30 mg Subcutaneous Q24H   Continuous Infusions: .  sodium bicarbonate 150 mEq in sterile water 1000 mL infusion 125 mL/hr at 03/26/16 0958     LOS: 0 days    Time spent: 25 minutes/    Dron Tanna Furry, MD [facility] Pager [phone]  If 7PM-7AM, please contact night-coverage [email or web address] Password [facility] 03/26/2016, 12:39 PM

## 2016-03-26 NOTE — H&P (Signed)
History and Physical  Patient Name: Lori Hutchinson     URK:[id number]    DOB: [birth year]    DOA: 03/25/2016 PCP: Yvonne Kendall, CNM (Inactive)   Patient coming from: Home  Chief Complaint: Arm pain and swelling and "I think I have rhabdo"  HPI: Lori Hutchinson is a 40 y.o. female with a past medical history significant for asthma and chart history of bulimia who presents with arm pain and swelling worsening over last 4 days.  The patient is in her usual state of health until this last Wednesday after a workout. She is relatively new to CrossFit, and was doing one of her first all arm workouts.  The next day, she had intense pain in both biceps, went to urgent care where they diagnosed muscle strain and prescribed Meloxicam, which she has been taking without relief.  Now, in the last two days, she is feeling increasing pain in both biceps, swelling without redness or warmth or fever, and generalized malaise.  She researched her symptoms online and found that she had rhabdomyolysis and so she came to the ER.  ED course: -Afebrile, heart rate 57, respirations and pulse ox normal, BP 124/83 -Na 139, K 4.0, Cr 0.8, WBC 5.1K, Hgb 13.3 -UA without Hgb -Urine pregnancy test negative -CK 15702 -She was given 3L NS and started on Bicarb drip and TRH were asked to eval for admission    ROS: Review of Systems  Constitutional: Positive for malaise/fatigue. Negative for chills, diaphoresis and fever.  Gastrointestinal: Negative for nausea and vomiting.  Musculoskeletal: Positive for myalgias.  Skin: Negative for rash.  All other systems reviewed and are negative.         Past Medical History:  Diagnosis Date  . Abnormal Pap smear 1996   Treatment cryosurgery  . Asthma   . Bacterial infection   . H/O borderline personality disorder 12/23/10  . H/O metrorrhagia 06/09/11  . H/O varicella   . Yeast infection     Past Surgical History:  Procedure Laterality Date  . WISDOM TOOTH  EXTRACTION      Social History: Patient lives alone.  The patient walks unassisted.  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  She does not smoke anymore.  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    Allergies  Allergen Reactions  . Erythromycin Nausea And Vomiting  . Percocet [Oxycodone-Acetaminophen] Nausea And Vomiting  . Shellfish Allergy Nausea And Vomiting    Stomach burning-- pt was unable to eat for days following reaction   . Iodine Rash  . Latex Rash    Family history: family history includes Asthma in her mother; Cancer in her father, maternal grandmother, paternal grandfather, and paternal grandmother; Heart disease in her father.  Prior to Admission medications   Medication Sig Start Date End Date Taking? Authorizing Provider  ibuprofen (ADVIL,MOTRIN) 100 MG tablet Take 200-400 mg by mouth every 6 (six) hours as needed for pain.    Yes Historical Provider, MD  meloxicam (MOBIC) 15 MG tablet Take 1 tablet (15 mg total) by mouth daily. 03/23/16  Yes Leonie Douglas, PA-C  PROAIR HFA 108 [phone] Base) MCG/ACT inhaler Inhale 2 puffs into the lungs every 4 (four) hours as needed for wheezing or shortness of breath.  02/24/16  Yes Historical Provider, MD       Physical Exam: BP 106/75 (BP Location: Right Wrist)   Pulse 79   Temp 98.2 F (36.8 C) (Oral)   Resp 16  Ht 5' 4"  (1.626 m)   Wt 41.8 kg (92 lb 4 oz)   LMP 10/10/2012 Comment: States that they just stopped.  SpO2 97%   BMI 15.83 kg/m  General appearance: Well-developed, thin adult female, alert and in no acute distress.   Eyes: Anicteric, conjunctiva pink, lids and lashes normal. PERRL.    ENT: No nasal deformity, discharge, epistaxis.  Hearing normal. OP moist without lesions.   Neck: No neck masses.  Trachea midline.  No thyromegaly/tenderness. Lymph: No cervical or supraclavicular lymphadenopathy. Skin: Warm and dry.  No jaundice.  No suspicious rashes or lesions.  No bruising, redness or warmth on  biceps. Cardiac: RRR, nl S1-S2, no murmurs appreciated.  Capillary refill is brisk.  JVP normal.  No LE edema.  Radial and DP pulses 2+ and symmetric. Respiratory: Normal respiratory rate and rhythm.  CTAB without rales or wheezes. Abdomen: Abdomen soft.  No TTP. No ascites, distension, hepatosplenomegaly.   MSK: No deformities or effusions.  Mild non-pitting swelling of both biceps.  Tender to palpation.  Cannot fully extend either elbow, lacks about 45 degrees in both arms.  No cyanosis or clubbing. Neuro: Cranial nerves normal.  Sensation intact to light touch. Speech is fluent.  Muscle strength normal.    Psych: Sensorium intact and responding to questions, attention normal.  Behavior appropriate.  Affect normal.  Judgment and insight appear normal.     Labs on Admission:  I have personally reviewed following labs and imaging studies: CBC:  Recent Labs Lab 03/25/16 2254  WBC 5.1  NEUTROABS 3.1  HGB 13.3  HCT 38.9  MCV 96.0  PLT 176   Basic Metabolic Panel:  Recent Labs Lab 03/25/16 2254  NA 139  K 4.0  CL 110  CO2 23  GLUCOSE 76  BUN 25*  CREATININE 0.82  CALCIUM 9.4   GFR: Estimated Creatinine Clearance: 60.8 mL/min (by C-G formula based on SCr of 0.82 mg/dL).  Liver Function Tests: No results for input(s): AST, ALT, ALKPHOS, BILITOT, PROT, ALBUMIN in the last 168 hours. No results for input(s): LIPASE, AMYLASE in the last 168 hours. No results for input(s): AMMONIA in the last 168 hours. Coagulation Profile: No results for input(s): INR, PROTIME in the last 168 hours. Cardiac Enzymes:  Recent Labs Lab 03/25/16 2312  CKTOTAL 15,702*   BNP (last 3 results) No results for input(s): PROBNP in the last 8760 hours. HbA1C: No results for input(s): HGBA1C in the last 72 hours. CBG: No results for input(s): GLUCAP in the last 168 hours. Lipid Profile: No results for input(s): CHOL, HDL, LDLCALC, TRIG, CHOLHDL, LDLDIRECT in the last 72 hours. Thyroid Function  Tests: No results for input(s): TSH, T4TOTAL, FREET4, T3FREE, THYROIDAB in the last 72 hours. Anemia Panel: No results for input(s): VITAMINB12, FOLATE, FERRITIN, TIBC, IRON, RETICCTPCT in the last 72 hours. Sepsis Labs: Invalid input(s): PROCALCITONIN, LACTICIDVEN No results found for this or any previous visit (from the past 240 hour(s)).       Radiological Exams on Admission: Personally reviewed: No results found.  EKG: Independently reviewed.     Assessment/Plan  1. Rhabdomyolysis:  No renal injury at this time.  CK >15K.  Treated with 3L IVF in ER.  Will alkalanize urine for now.   -Bicarb drip  -Trend CK q12 hrs -Trend BMP for bicarb, K, Ca -Trend urine pH -Strict I/Os    2. Asthma:  Not active. -Hold albuterol         DVT prophylaxis: Lovenox  Code Status:  FULL  Family Communication: None presetn  Disposition Plan: Anticipate IV fluids and monitor electrolytes and renal function and trend CK. Consults called: None Admission status: INPATIENT, med surg        Medical decision making: Patient seen at 12:20 AM on 03/26/2016.  The patient was discussed with Dr. Gilford Raid.  What exists of the patient's chart was reviewed in depth and summarized above.  Clinical condition: stable.        Edwin Dada [facility] Pager [phone]      At the time of admission, it appears that the appropriate admission status for this patient is INPATIENT. This is judged to be reasonable and necessary in order to provide the required intensity of service (in particular IV fluids and close monitoring of electroyltes every 12 hours) to ensure the patient's safety without threatening deterioration  Of renal function leading to renal failure.  I certify that at the point of admission it is my clinical judgment that the patient will require inpatient hospital care spanning beyond 2 midnights from the point of admission and that early discharge would result in  unnecessary risk of decompensation and readmission or threat to life, limb or bodily function.

## 2016-03-26 NOTE — ED Notes (Signed)
No respiratory or acute distress noted alert and oriented x 3 call light in reach states feeling some better with IV fluids.

## 2016-03-27 DIAGNOSIS — R001 Bradycardia, unspecified: Secondary | ICD-10-CM

## 2016-03-27 LAB — URINE MICROSCOPIC-ADD ON

## 2016-03-27 LAB — BASIC METABOLIC PANEL
Anion gap: 3 — ABNORMAL LOW (ref 5–15)
BUN: 19 mg/dL (ref 6–20)
CALCIUM: 8.3 mg/dL — AB (ref 8.9–10.3)
CHLORIDE: 115 mmol/L — AB (ref 101–111)
CO2: 25 mmol/L (ref 22–32)
CREATININE: 0.77 mg/dL (ref 0.44–1.00)
GFR calc Af Amer: >60 mL/min (ref >60)
GFR calc non Af Amer: >60 mL/min (ref >60)
Glucose, Bld: 96 mg/dL (ref 65–99)
Potassium: 4.1 mmol/L (ref 3.5–5.1)
Sodium: 143 mmol/L (ref 135–145)

## 2016-03-27 LAB — URINALYSIS, ROUTINE W REFLEX MICROSCOPIC
BILIRUBIN URINE: NEGATIVE
GLUCOSE, UA: NEGATIVE mg/dL
Ketones, ur: NEGATIVE mg/dL
Leukocytes, UA: NEGATIVE
Nitrite: NEGATIVE
Protein, ur: NEGATIVE mg/dL
SPECIFIC GRAVITY, URINE: 1.011 (ref 1.005–1.030)
pH: 7 (ref 5.0–8.0)

## 2016-03-27 LAB — TSH: TSH: 2.429 u[IU]/mL (ref 0.350–4.500)

## 2016-03-27 LAB — CK: Total CK: 22312 U/L — ABNORMAL HIGH (ref 38–234)

## 2016-03-27 LAB — MYOGLOBIN, SERUM: MYOGLOBIN: 1441 ng/mL — AB (ref 25–58)

## 2016-03-27 MED ORDER — SODIUM CHLORIDE 0.45 % IV SOLN
INTRAVENOUS | Status: DC
  Administered 2016-03-27: 1000 mL via INTRAVENOUS
  Administered 2016-03-27: 15:00:00 via INTRAVENOUS
  Administered 2016-03-28 – 2016-03-29 (×3): 1000 mL via INTRAVENOUS
  Administered 2016-03-29: 150 mL/h via INTRAVENOUS

## 2016-03-27 NOTE — Progress Notes (Signed)
PROGRESS NOTE    Lori Hutchinson  WSF:[id number] DOB: 1976-01-03 DOA: 03/25/2016 PCP: Yvonne Kendall, CNM (Inactive)   Brief Narrative: 40 year old female with history of anorexia nervosa with bulimia, asthma presented with arm pain and swelling found to have rhabdomyolysis.  Assessment & Plan:   #  Non-traumatic rhabdomyolysis: In the setting of exercise at fitness center. Patient reported taking energy drinks and supplemental proteins. Also reported dehydration, not sleeping enough and exertion recently. Exactly unknown if energy drinks might have contributed rhabdomyolysis. There are few case reports about this. She has normal kidney function. Continue IV fluid.  -CK trended up to >22 K today with normal kidney function -switched IVF to half NS because of hyperchloremia.  -urine tox screen negative. TSH acceptable. -monitor CK level. Continue to provide supportive care. Clinically stable.   #History of anorexia nervosa with bulimia: Outpatient follow-up.  #Symptomatically sinus bradycardia likely normal heart rate in this patient who does regular exercise. Continue to monitor. 12 lead EKG showed sinus bradycardia. TSH acceptable.   DVT prophylaxis: Lovenox subcutaneous Code Status: Full code Family Communication: Patient's mother at bedside. Disposition Plan: Likely discharge home in 1-2 days   Consultants:   None  Procedures: None Antimicrobials: None  Subjective: Patient was seen and examined at bedside. Reported that upper extremity pain is better but still feels swollen. Denies fever, chills, headache, dizziness, nausea, vomiting, chest pain, shortness of breath, abdominal pain. Patient's mother at bedside. She feels weak.  Objective: Vitals:   03/26/16 1400 03/26/16 2039 03/27/16 0542 03/27/16 1212  BP: 103/74 101/65 105/64 (!) 126/95  Pulse: 88 62 (!) 57 65  Resp: 16 16 16 16   Temp: 98.9 F (37.2 C) 98.5 F (36.9 C) 97.8 F (36.6 C) 98.7 F (37.1 C)  TempSrc:  Oral Oral Oral Oral  SpO2: 96% 99% 98% 100%  Weight:      Height:        Intake/Output Summary (Last 24 hours) at 03/27/16 1334 Last data filed at 03/27/16 1026  Gross per 24 hour  Intake          3763.33 ml  Output             2000 ml  Net          1763.33 ml   Filed Weights   03/25/16 2013  Weight: 41.8 kg (92 lb 4 oz)    Examination:  General exam: Pleasant female lying comfortably. Denies any needs.  Respiratory system: Clear to auscultation bilateral, no crackle or wheeze Cardiovascular system: Regular rate rhythm, S1-S2 normal. No pedal edema. Gastrointestinal system: Abdomen is soft, nontender, nondistended. Bowel sound positive. Central nervous system: Alert, awake and following commands Extremities: Symmetric 5 x 5 power. No upper extremity muscle swollen or tenderness. Skin: No rashes, lesions or ulcers Psychiatry: Judgement and insight appear normal. Mood & affect appropriate.     Data Reviewed: I have personally reviewed following labs and imaging studies  CBC:  Recent Labs Lab 03/25/16 2254  WBC 5.1  NEUTROABS 3.1  HGB 13.3  HCT 38.9  MCV 96.0  PLT 017   Basic Metabolic Panel:  Recent Labs Lab 03/25/16 2254 03/26/16 1122 03/27/16 0426  NA 139 141 143  K 4.0 3.7 4.1  CL 110 110 115*  CO2 23 26 25   GLUCOSE 76 103* 96  BUN 25* 17 19  CREATININE 0.82 0.79 0.77  CALCIUM 9.4 8.7* 8.3*   GFR: Estimated Creatinine Clearance: 62.3 mL/min (by C-G formula based on SCr of 0.77  mg/dL). Liver Function Tests: No results for input(s): AST, ALT, ALKPHOS, BILITOT, PROT, ALBUMIN in the last 168 hours. No results for input(s): LIPASE, AMYLASE in the last 168 hours. No results for input(s): AMMONIA in the last 168 hours. Coagulation Profile: No results for input(s): INR, PROTIME in the last 168 hours. Cardiac Enzymes:  Recent Labs Lab 03/25/16 2312 03/26/16 1122 03/27/16 0426  CKTOTAL 15,702* 16,994* 22,312*   BNP (last 3 results) No results for  input(s): PROBNP in the last 8760 hours. HbA1C: No results for input(s): HGBA1C in the last 72 hours. CBG: No results for input(s): GLUCAP in the last 168 hours. Lipid Profile: No results for input(s): CHOL, HDL, LDLCALC, TRIG, CHOLHDL, LDLDIRECT in the last 72 hours. Thyroid Function Tests:  Recent Labs  03/27/16 1025  TSH 2.429   Anemia Panel: No results for input(s): VITAMINB12, FOLATE, FERRITIN, TIBC, IRON, RETICCTPCT in the last 72 hours. Sepsis Labs: No results for input(s): PROCALCITON, LATICACIDVEN in the last 168 hours.  No results found for this or any previous visit (from the past 240 hour(s)).       Radiology Studies: No results found.      Scheduled Meds: . enoxaparin (LOVENOX) injection  30 mg Subcutaneous Q24H   Continuous Infusions: . sodium chloride       LOS: 1 day    Time spent: 26 minutes/    Dron Tanna Furry, MD [facility] Pager [phone]  If 7PM-7AM, please contact night-coverage [email or web address] Password [email or web address] 03/27/2016, 1:34 PM

## 2016-03-27 NOTE — Progress Notes (Signed)
Nutrition Brief Note  Patient identified for BMI of 15.83, underweight status.  Patient in room with mother at bedside. Pt reports increased activity levels which have boosted her appetite. She states she eats every 1-2 hours with some source of protein. Pt states her UBW is 106.5 lb. Per chart review, pt has lost 19 lb. Pt states this is an error as she wasn't in the chair properly to be weighed. Pt reports her weight is actually around 115 lb (this is in the chart on 10/12).   Patient with history of anorexia nervosa and bulimia. Pt refers to "being sick" for 2 months PTA.  Pt reports drinking high amounts of caffeine from coffee, soda and tea. She just recently added energy drinks (1/day) to help her decreased energy levels. Explained the role caffeine plays in mood and anxiety levels. Recommended to wean off caffeine if she can. Recommended pt consume adequate amounts of protein, especially with increased activity levels. Pt with protein bars and nuts to snack on at bedside. Declined protein supplements at this time.  If not already followed, would recommend outpatient nutrition therapy for history of eating disorders.  Wt Readings from Last 15 Encounters:  03/25/16 92 lb 4 oz (41.8 kg)  03/23/16 115 lb (52.2 kg)  01/12/16 111 lb (50.3 kg)  11/18/15 110 lb 9.6 oz (50.2 kg)  09/23/15 110 lb 12.8 oz (50.3 kg)  08/14/14 113 lb 3.2 oz (51.3 kg)  08/02/14 112 lb (50.8 kg)  07/13/14 115 lb (52.2 kg)  06/28/14 111 lb (50.3 kg)  06/18/14 114 lb (51.7 kg)  12/20/11 120 lb (54.4 kg)    Body mass index is 15.83 kg/m. Patient meets criteria for underweight based on current BMI.   Current diet order is regular, patient is consuming approximately 100% of meals at this time. Labs and medications reviewed.   No nutrition interventions warranted at this time. If nutrition issues arise, please consult RD.   Clayton Bibles, MS, RD, LDN Pager: [phone] After Hours Pager: [phone]

## 2016-03-28 LAB — BASIC METABOLIC PANEL
ANION GAP: 2 — AB (ref 5–15)
BUN: 18 mg/dL (ref 6–20)
CHLORIDE: 114 mmol/L — AB (ref 101–111)
CO2: 25 mmol/L (ref 22–32)
CREATININE: 0.75 mg/dL (ref 0.44–1.00)
Calcium: 8.4 mg/dL — ABNORMAL LOW (ref 8.9–10.3)
GFR calc non Af Amer: >60 mL/min (ref >60)
Glucose, Bld: 93 mg/dL (ref 65–99)
Potassium: 3.7 mmol/L (ref 3.5–5.1)
Sodium: 141 mmol/L (ref 135–145)

## 2016-03-28 LAB — CK
Total CK: 21745 U/L — ABNORMAL HIGH (ref 38–234)
Total CK: 25958 U/L — ABNORMAL HIGH (ref 38–234)

## 2016-03-28 MED ORDER — MENTHOL 3 MG MT LOZG: 1.0000 | LOZENGE | OROMUCOSAL | Status: DC | PRN

## 2016-03-28 NOTE — Progress Notes (Addendum)
PROGRESS NOTE    Lori Hutchinson  TOI:[id number] DOB: 07-04-1975 DOA: 03/25/2016 PCP: Yvonne Kendall, CNM (Inactive)   Brief Narrative: 40 year old female with history of anorexia nervosa with bulimia, asthma presented with arm pain and swelling found to have rhabdomyolysis.  Assessment & Plan:   #  Non-traumatic rhabdomyolysis: In the setting of exercise at fitness center. Patient reported taking energy drinks and supplemental proteins. Also reported dehydration, not sleeping enough and exertion recently. Exactly unknown if energy drinks might have contributed rhabdomyolysis. There are few case reports about this. She has normal kidney function.  - no significant improvement in CK level today. I will continue IVF at least one more day. Repeat CK in am. Renal function normal. Upper extremities pain and swelling improving.  -urine tox screen negative. TSH acceptable. -continue supportive care.   #History of anorexia nervosa with bulimia: Outpatient follow-up.  #Symptomatically sinus bradycardia likely normal heart rate in this patient who does regular exercise. Continue to monitor. 12 lead EKG showed sinus bradycardia. TSH acceptable.  # Underweight: likely intentional. She goes to fitness and watch her food very closely. Education provided to the patient.  Outpatient follow up with PCP made for 10/19 to repeat labs if patient is discharged before that. Discussed with the patient and her mother in detail at bedside. Also discussed with RN.   DVT prophylaxis: Lovenox subcutaneous Code Status: Full code Family Communication: Patient's mother at bedside. Disposition Plan: Likely discharge home in 1-2 days if improvement in CK level.   Consultants:   None  Procedures: None Antimicrobials: None  Subjective: Patient was seen and examined at bedside. Patient reported that upper extremity pain and swelling improving slowly. Denies fever, chills, headache, dizziness, nausea, vomiting,  chest pain or shortness of breath. No abdominal pain. Patient's mother at bedside. Objective: Vitals:   03/27/16 1212 03/27/16 1509 03/27/16 2112 03/28/16 0526  BP: (!) 126/95 101/68 104/69 103/74  Pulse: 65 (!) 56 62 (!) 50  Resp: 16 16 16 16   Temp: 98.7 F (37.1 C) 98.3 F (36.8 C) 98.1 F (36.7 C) 98 F (36.7 C)  TempSrc: Oral Oral Oral Oral  SpO2: 100% 99% 100% 99%  Weight:      Height:        Intake/Output Summary (Last 24 hours) at 03/28/16 1206 Last data filed at 03/28/16 0944  Gross per 24 hour  Intake             5010 ml  Output                0 ml  Net             5010 ml   Filed Weights   03/25/16 2013  Weight: 41.8 kg (92 lb 4 oz)    Examination:  General exam: Pleasant young female lying comfortably.  Respiratory system: Clear to auscultation bilateral, no wheezing or crackle Cardiovascular system: Regular rate rhythm, S1 and S2 normal. No pedal edema. Gastrointestinal system: Abdomen soft, nontender, nondistended. Bowel sound positive Central nervous system: Alert, awake and following commands Extremities: Symmetric 5 x 5 power. No upper extremity muscle swelling or tenderness. Able to move upper extremities without difficulties. Skin: No rashes, lesions or ulcers Psychiatry: Judgement and insight appear normal. Mood & affect appropriate.     Data Reviewed: I have personally reviewed following labs and imaging studies  CBC:  Recent Labs Lab 03/25/16 2254  WBC 5.1  NEUTROABS 3.1  HGB 13.3  HCT 38.9  MCV 96.0  PLT  514   Basic Metabolic Panel:  Recent Labs Lab 03/25/16 2254 03/26/16 1122 03/27/16 0426 03/28/16 0443  NA 139 141 143 141  K 4.0 3.7 4.1 3.7  CL 110 110 115* 114*  CO2 23 26 25 25   GLUCOSE 76 103* 96 93  BUN 25* 17 19 18   CREATININE 0.82 0.79 0.77 0.75  CALCIUM 9.4 8.7* 8.3* 8.4*   GFR: Estimated Creatinine Clearance: 62.3 mL/min (by C-G formula based on SCr of 0.75 mg/dL). Liver Function Tests: No results for input(s):  AST, ALT, ALKPHOS, BILITOT, PROT, ALBUMIN in the last 168 hours. No results for input(s): LIPASE, AMYLASE in the last 168 hours. No results for input(s): AMMONIA in the last 168 hours. Coagulation Profile: No results for input(s): INR, PROTIME in the last 168 hours. Cardiac Enzymes:  Recent Labs Lab 03/25/16 2312 03/26/16 1122 03/27/16 0426 03/28/16 0443  CKTOTAL 15,702* 16,994* 22,312* 21,745*   BNP (last 3 results) No results for input(s): PROBNP in the last 8760 hours. HbA1C: No results for input(s): HGBA1C in the last 72 hours. CBG: No results for input(s): GLUCAP in the last 168 hours. Lipid Profile: No results for input(s): CHOL, HDL, LDLCALC, TRIG, CHOLHDL, LDLDIRECT in the last 72 hours. Thyroid Function Tests:  Recent Labs  03/27/16 1025  TSH 2.429   Anemia Panel: No results for input(s): VITAMINB12, FOLATE, FERRITIN, TIBC, IRON, RETICCTPCT in the last 72 hours. Sepsis Labs: No results for input(s): PROCALCITON, LATICACIDVEN in the last 168 hours.  No results found for this or any previous visit (from the past 240 hour(s)).       Radiology Studies: No results found.      Scheduled Meds: . enoxaparin (LOVENOX) injection  30 mg Subcutaneous Q24H   Continuous Infusions: . sodium chloride 1,000 mL (03/28/16 0521)     LOS: 2 days    Time spent: 25 minutes/    Yui Mulvaney Tanna Furry, MD [facility] Pager [phone]  If 7PM-7AM, please contact night-coverage [email or web address] Password [email or web address] 03/28/2016, 12:06 PM

## 2016-03-29 LAB — BASIC METABOLIC PANEL
Anion gap: 5 (ref 5–15)
BUN: 14 mg/dL (ref 6–20)
CALCIUM: 8.9 mg/dL (ref 8.9–10.3)
CO2: 25 mmol/L (ref 22–32)
CREATININE: 0.83 mg/dL (ref 0.44–1.00)
Chloride: 113 mmol/L — ABNORMAL HIGH (ref 101–111)
GFR calc non Af Amer: >60 mL/min (ref >60)
Glucose, Bld: 98 mg/dL (ref 65–99)
Potassium: 4 mmol/L (ref 3.5–5.1)
SODIUM: 143 mmol/L (ref 135–145)

## 2016-03-29 LAB — CK: CK TOTAL: 19636 U/L — AB (ref 38–234)

## 2016-03-29 NOTE — Progress Notes (Signed)
Discharge instructions until no further questions ask. Iv discontinued. Pt states has a follow up appointment with primary care MD for tomorrow.

## 2016-03-29 NOTE — Discharge Summary (Addendum)
[facility]  Physician Discharge Summary   Patient ID: Lori Hutchinson MRN: [id number] DOB/AGE: May 09, 1976 40 y.o.  Admit date: 03/25/2016 Discharge date: 03/29/2016  PCP: [name], MD  DISCHARGE DIAGNOSES:  Principal Problem:   Non-traumatic rhabdomyolysis Active Problems:   Sinus bradycardia   RECOMMENDATIONS FOR OUTPATIENT FOLLOW UP: 1. Patient to follow-up with her primary care provider. She has an appointment tomorrow. 2. Consider repeating CK level in the next few days.   DISCHARGE CONDITION: fair  Diet recommendation: Regular  Filed Weights   03/25/16 2013  Weight: 41.8 kg (92 lb 4 oz)    INITIAL HISTORY: 40 year old female with history of anorexia nervosa with bulimia, asthma presented with arm pain and swelling found to have rhabdomyolysis. She had recently commenced [employer].  Consultations:  None  Procedures:  None  HOSPITAL COURSE:   Non-traumatic rhabdomyolysis In the setting of exercise at fitness center. Patient reported taking energy drinks and supplemental proteins. Also reported dehydration, not sleeping enough and exertion recently. Exactly unknown if energy drinks might have contributed rhabdomyolysis. There are few case reports about this. She has normal kidney function. Patient was aggressively hydrated. His CK levels initially climbed but then has improved today. She denies any pain in her arms or legs today but still feels that her muscles sore. She has ambulated without any difficulty. She knows to keep her several well-hydrated. Wishes to go home. She has an appointment to see her PCP tomorrow. Urine tox screen negative. TSH acceptable.  History of anorexia nervosa with bulimia Outpatient follow-up.  Sinus bradycardia Likely normal heart rate in this patient who does regular exercise. TSH acceptable.  Underweight Likely intentional. She goes to fitness and watches her food very closely. Education provided to the  patient.  Overall, stable and improved. Okay for discharge home today.    PERTINENT LABS:  The results of significant diagnostics from this hospitalization (including imaging, microbiology, ancillary and laboratory) are listed below for reference.     Labs: Basic Metabolic Panel:  Recent Labs Lab 03/25/16 2254 03/26/16 1122 03/27/16 0426 03/28/16 0443 03/29/16 0434  NA 139 141 143 141 143  K 4.0 3.7 4.1 3.7 4.0  CL 110 110 115* 114* 113*  CO2 23 26 25 25 25   GLUCOSE 76 103* 96 93 98  BUN 25* 17 19 18 14   CREATININE 0.82 0.79 0.77 0.75 0.83  CALCIUM 9.4 8.7* 8.3* 8.4* 8.9    CBC:  Recent Labs Lab 03/25/16 2254  WBC 5.1  NEUTROABS 3.1  HGB 13.3  HCT 38.9  MCV 96.0  PLT 224   Cardiac Enzymes:  Recent Labs Lab 03/26/16 1122 03/27/16 0426 03/28/16 0443 03/28/16 1026 03/29/16 0434  CKTOTAL 16,994* 22,312* 21,745* 25,958* 19,636*    DISCHARGE EXAMINATION: Vitals:   03/28/16 1437 03/28/16 2129 03/29/16 0552 03/29/16 0951  BP: 108/80 105/61 115/64 108/71  Pulse: 64 70 (!) 56 (!) 52  Resp: 16 16 16    Temp: 98.8 F (37.1 C) 98.8 F (37.1 C) 97.6 F (36.4 C) 97.9 F (36.6 C)  TempSrc: Oral Oral Oral Oral  SpO2: 100% 99% 99% 100%  Weight:      Height:       General appearance: alert, cooperative, appears stated age and no distress Resp: clear to auscultation bilaterally Cardio: regular rate and rhythm, S1, S2 normal, no murmur, click, rub or gallop GI: soft, non-tender; bowel sounds normal; no masses,  no organomegaly Extremities: extremities normal, atraumatic, no cyanosis or edema   DISPOSITION: Home  Discharge Instructions    Call MD for:  difficulty breathing, headache or visual disturbances    Complete by:  As directed    Call MD for:  extreme fatigue    Complete by:  As directed    Call MD for:  persistant dizziness or light-headedness    Complete by:  As directed    Call MD for:  persistant nausea and vomiting    Complete by:  As  directed    Call MD for:  severe uncontrolled pain    Complete by:  As directed    Call MD for:  temperature >100.4    Complete by:  As directed    Diet general    Complete by:  As directed    Discharge instructions    Complete by:  As directed    Please see instructions regarding rhabdomyolysis. Avoid physical exertion for the next 2-3 weeks. You may do usual home activities. Avoid any exercises or lifting heavy weights for now. Continue to remain well-hydrated at home. Patient to follow-up with your primary care provider.  You were cared for by a hospitalist during your hospital stay. If you have any questions about your discharge medications or the care you received while you were in the hospital after you are discharged, you can call the unit and asked to speak with the hospitalist on call if the hospitalist that took care of you is not available. Once you are discharged, your primary care physician will handle any further medical issues. Please note that NO REFILLS for any discharge medications will be authorized once you are discharged, as it is imperative that you return to your primary care physician (or establish a relationship with a primary care physician if you do not have one) for your aftercare needs so that they can reassess your need for medications and monitor your lab values. If you do not have a primary care physician, you can call [phone] for a physician referral.   Increase activity slowly    Complete by:  As directed       ALLERGIES:  Allergies  Allergen Reactions  . Erythromycin Nausea And Vomiting  . Percocet [Oxycodone-Acetaminophen] Nausea And Vomiting  . Shellfish Allergy Nausea And Vomiting    Stomach burning-- pt was unable to eat for days following reaction   . Iodine Rash  . Latex Rash     Current Discharge Medication List    CONTINUE these medications which have NOT CHANGED   Details  PROAIR HFA 108 (90 Base) MCG/ACT inhaler Inhale 2 puffs into the  lungs every 4 (four) hours as needed for wheezing or shortness of breath.       STOP taking these medications     ibuprofen (ADVIL,MOTRIN) 100 MG tablet      meloxicam (MOBIC) 15 MG tablet           Follow-up Information    [name], MD. Go on 03/30/2016.   Specialties:  Family Medicine, Sports Medicine Contact information: [address] [place] [address] [phone]           [place]: 35 minutes  [facility] Pager [phone]  03/29/2016, 1:15 PM

## 2016-03-29 NOTE — Discharge Instructions (Signed)
Rhabdomyolysis Rhabdomyolysis is a condition that results when muscle cells break down and release substances into the blood that can damage the kidneys. It happens because of damage to the muscles that move bones (skeletal muscle). When you damage this type of muscle, substances inside of your muscle cells are released into your blood. This includes a certain protein called myoglobin. Your kidneys must filter myoglobin from your blood. Large amounts of myoglobin can cause kidney damage or kidney failure. Other substances that are released by muscle cells may upset the balance of the minerals (electrolytes) in your blood. This makes your blood become too acidic (acidosis). CAUSES This condition is caused by muscle damage. Muscle damage often results from:  Extreme overuse of the muscles.  An injury that crushes or compresses a muscle.  Use of illegal drugs, especially cocaine.  Alcohol abuse. Other possible causes include:  Prescription medicines, such as statins, amphetamines, and opiates.  Infections.  Inherited muscle diseases.  High fever.  Heatstroke.  Dehydration.  Seizures.  Surgery. RISK FACTORS This condition is more likely to develop in:  People who have a family history of muscle disease.  People who participate in extreme sports, such as marathon running.  People who have diabetes.  Older people.  People who abuse drugs or alcohol. SYMPTOMS Symptoms of this condition vary. Some people have very few symptoms, while others have many symptoms. The most common symptoms include:  Muscle pain and swelling.  Muscle weakness.  Dark urine.  Feeling weak and tired. Other symptoms include:  Nausea and vomiting.  Fever.  Pain in the abdomen.  Joint pain. Signs and symptoms of complications from rhabdomyolysis may include:  Heart rhythm abnormalities (arrhythmias).  Seizures.  Reduced urine production because of kidney failure.  Very low blood  pressure (shock).  Uncontrolled bleeding. DIAGNOSIS This condition may be diagnosed based on:  Your symptoms and medical history.  A physical exam.  Blood tests to check for:  Muscle breakdown products in the blood (creatine kinase).  Myoglobin.  Acidosis.  Electrolyte imbalances.  Urine tests to check for myoglobin. You may also have other tests to check for causes of muscle damage and to check for complications. TREATMENT Treatment for this condition focuses on keeping up your fluid level, reversing acidosis, and protecting your kidneys. Treatment may include:  Fluids and medicines given through an IV tube that is inserted into one of your veins.  Medicines, such as:  Sodium bicarbonate to reduce acidosis.  Electrolytes to restore the balance of these minerals in your body.  Hemodialysis. This treatment uses an artificial kidney machine to filter your blood while you recover. You may have this if other treatments are not helping. HOME CARE INSTRUCTIONS  Take medicines only as directed by your health care provider.  Rest at home until your health care provider says that you can return to your normal activities.  Drink enough fluid to keep your urine clear or pale yellow.  Do not exercise with great energy and effort (strenuously). Ask your health care provider what level of exercise is safe for you.  Do not abuse drugs or alcohol. If you are struggling with drug or alcohol use, ask your health care provider for help.  Keep all follow-up visits as directed by your health care provider. This is important. SEEK MEDICAL CARE IF:  You develop symptoms of rhabdomyolysis at home after treatment. SEEK IMMEDIATE MEDICAL CARE IF:  You have a seizure.  You bleed easily or cannot control bleeding.  You  cannot make urine.  You have chest pain.  You have trouble breathing.   This information is not intended to replace advice given to you by your health care provider.  Make sure you discuss any questions you have with your health care provider.   Document Released: 05/11/2004 Document Revised: 10/13/2014 Document Reviewed: 06/03/2014 Elsevier Interactive Patient Education [employer].

## 2016-03-30 ENCOUNTER — Encounter (HOSPITAL_COMMUNITY): Payer: Self-pay | Admitting: *Deleted

## 2016-03-30 ENCOUNTER — Ambulatory Visit (INDEPENDENT_AMBULATORY_CARE_PROVIDER_SITE_OTHER): Payer: BC Managed Care – PPO | Admitting: Physician Assistant

## 2016-03-30 ENCOUNTER — Telehealth: Payer: Self-pay | Admitting: Family Medicine

## 2016-03-30 ENCOUNTER — Emergency Department (HOSPITAL_COMMUNITY)
Admission: EM | Admit: 2016-03-30 | Discharge: 2016-03-30 | Disposition: A | Discharge status: Home / Self Care | Payer: BC Managed Care – PPO | Attending: Emergency Medicine | Admitting: Emergency Medicine

## 2016-03-30 VITALS — BP 96/68 | HR 61 | Temp 98.8°F | Resp 16 | Ht 64.0 in | Wt 118.0 lb

## 2016-03-30 DIAGNOSIS — J45909 Unspecified asthma, uncomplicated: Secondary | ICD-10-CM | POA: Diagnosis not present

## 2016-03-30 DIAGNOSIS — R740 Nonspecific elevation of levels of transaminase and lactic acid dehydrogenase [LDH]: Secondary | ICD-10-CM | POA: Diagnosis not present

## 2016-03-30 DIAGNOSIS — M6282 Rhabdomyolysis: Secondary | ICD-10-CM

## 2016-03-30 DIAGNOSIS — R7401 Elevation of levels of liver transaminase levels: Secondary | ICD-10-CM

## 2016-03-30 DIAGNOSIS — Z87891 Personal history of nicotine dependence: Secondary | ICD-10-CM | POA: Diagnosis not present

## 2016-03-30 DIAGNOSIS — M7989 Other specified soft tissue disorders: Secondary | ICD-10-CM | POA: Diagnosis present

## 2016-03-30 DIAGNOSIS — Z91040 Latex allergy status: Secondary | ICD-10-CM | POA: Diagnosis not present

## 2016-03-30 LAB — CBC WITH DIFFERENTIAL/PLATELET
Basophils Absolute: 0 10*3/uL (ref 0.0–0.1)
Basophils Relative: 0 %
EOS ABS: 0.1 10*3/uL (ref 0.0–0.7)
Eosinophils Relative: 3 %
HCT: 40.7 % (ref 36.0–46.0)
HEMOGLOBIN: 13.6 g/dL (ref 12.0–15.0)
LYMPHS ABS: 0.8 10*3/uL (ref 0.7–4.0)
Lymphocytes Relative: 21 %
MCH: 32.4 pg (ref 26.0–34.0)
MCHC: 33.4 g/dL (ref 30.0–36.0)
MCV: 96.9 fL (ref 78.0–100.0)
MONOS PCT: 9 %
Monocytes Absolute: 0.3 10*3/uL (ref 0.1–1.0)
NEUTROS PCT: 67 %
Neutro Abs: 2.7 10*3/uL (ref 1.7–7.7)
Platelets: 208 10*3/uL (ref 150–400)
RBC: 4.2 MIL/uL (ref 3.87–5.11)
RDW: 12.7 % (ref 11.5–15.5)
WBC: 4 10*3/uL (ref 4.0–10.5)

## 2016-03-30 LAB — COMPLETE METABOLIC PANEL WITH GFR
ALBUMIN: 3.9 g/dL (ref 3.6–5.1)
ALK PHOS: 66 U/L (ref 33–115)
ALT: 251 U/L — ABNORMAL HIGH (ref 6–29)
AST: 342 U/L — ABNORMAL HIGH (ref 10–30)
BUN: 17 mg/dL (ref 7–25)
CALCIUM: 9.1 mg/dL (ref 8.6–10.2)
CO2: 25 mmol/L (ref 20–31)
Chloride: 108 mmol/L (ref 98–110)
Creat: 0.88 mg/dL (ref 0.50–1.10)
GFR, EST NON AFRICAN AMERICAN: 83 mL/min (ref >=60)
GFR, Est African American: >89 mL/min (ref >=60)
Glucose, Bld: 86 mg/dL (ref 65–99)
POTASSIUM: 4 mmol/L (ref 3.5–5.3)
Sodium: 141 mmol/L (ref 135–146)
Total Bilirubin: 0.5 mg/dL (ref 0.2–1.2)
Total Protein: 6.2 g/dL (ref 6.1–8.1)

## 2016-03-30 LAB — COMPREHENSIVE METABOLIC PANEL
ALBUMIN: 4.4 g/dL (ref 3.5–5.0)
ALT: 273 U/L — AB (ref 14–54)
AST: 357 U/L — AB (ref 15–41)
Alkaline Phosphatase: 68 U/L (ref 38–126)
Anion gap: 7 (ref 5–15)
BUN: 23 mg/dL — AB (ref 6–20)
CHLORIDE: 106 mmol/L (ref 101–111)
CO2: 27 mmol/L (ref 22–32)
CREATININE: 0.87 mg/dL (ref 0.44–1.00)
Calcium: 9.5 mg/dL (ref 8.9–10.3)
GFR calc Af Amer: >60 mL/min (ref >60)
GFR calc non Af Amer: >60 mL/min (ref >60)
GLUCOSE: 75 mg/dL (ref 65–99)
Potassium: 4.1 mmol/L (ref 3.5–5.1)
SODIUM: 140 mmol/L (ref 135–145)
Total Bilirubin: 0.7 mg/dL (ref 0.3–1.2)
Total Protein: 7.3 g/dL (ref 6.5–8.1)

## 2016-03-30 LAB — CK
CK TOTAL: 12129 U/L — AB (ref 38–234)
Total CK: 11999 U/L (ref 7–177)

## 2016-03-30 NOTE — Progress Notes (Signed)
[facility] [address], [place] [address] [phone]  By signing my name below, I, [name], attest that this documentation has been prepared under the direction and in the presence of Stephanie English, PA-C. Electronically Signed: [name], Scribe. 03/30/2016 , 11:34 AM .  Patient was seen in Room 8 .  Date:  03/30/2016   Name:  Lori Hutchinson   DOB:  01-30-1976   MRN:  [id number]  PCP:  [name], MD    History of Present Illness: Chief Complaint  Patient presents with   Follow-up    Hospital/ Needs CK level checked    Lori Hutchinson is a 40 y.o. female patient who presents to [facility] for hospital follow up. She requests having CK levels checked. Her last CK was 19.6k yesterday in the ED. She is an [employer] copy. She was diagnosed with epstein barr in 2006, in [place].   She's been traveling for business and then continued to travel visiting her sister, who had a baby recently. She was consistently drinking only coffee and Monster energy drinks, without much water. She noticed gaining about 15 lbs within 5 days. She decided to go to [employer] upon her return to town. She denies alcohol use or illicit drug use.   She's been drinking plenty of water yesterday after discharge from hospital. She denies any changes with her breathing. Her norm is being very active so she wants to return to [employer] and exercising.   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   PMS (premenstrual syndrome) 12/20/2011    Past Medical History:  Diagnosis Date   Abnormal Pap smear 1996   Treatment cryosurgery   Asthma    Bacterial infection    H/O borderline personality disorder 12/23/10   H/O metrorrhagia 06/09/11   H/O varicella    Yeast infection     Past  Surgical History:  Procedure Laterality Date   WISDOM TOOTH EXTRACTION      Social History  Substance Use Topics   Smoking status: Former Smoker    Years: 13.00    Quit date: 06/18/2002   Smokeless tobacco: Never Used   Alcohol use No    Family History  Problem Relation Age of Onset   Cancer Maternal Grandmother     Ovarian   Asthma Mother    Heart disease Father    Cancer Father     Lung   Cancer Paternal Grandmother     Colon   Cancer Paternal Grandfather     Colon    Allergies  Allergen Reactions   Erythromycin Nausea And Vomiting   Percocet [Oxycodone-Acetaminophen] Nausea And Vomiting   Shellfish Allergy Nausea And Vomiting    Stomach burning-- pt was unable to eat for days following reaction    Iodine Rash   Latex Rash    Medication list has been reviewed and updated.  Current Outpatient Prescriptions on File Prior to Visit  Medication Sig Dispense Refill   PROAIR HFA 108 (90 Base) MCG/ACT inhaler Inhale 2 puffs into the lungs every 4 (four) hours as needed for wheezing or shortness of breath.      No current facility-administered medications on file prior to visit.     Review of Systems  Constitutional: Positive for malaise/fatigue. Negative for chills, diaphoresis, fever and weight loss.  Respiratory: Negative for cough, shortness of breath and wheezing.   Cardiovascular: Negative for chest pain, palpitations and leg swelling.  Neurological: Positive for weakness. Negative for dizziness and tingling.  Psychiatric/Behavioral: Negative for substance abuse. The patient is nervous/anxious.      Physical Examination: BP 96/68 (BP Location: Right Arm, Patient Position: Sitting, Cuff Size: Normal)    Pulse 61    Temp 98.8 F (37.1 C) (Oral)    Resp 16    Ht 5' 4"  (1.626 m)    Wt 118 lb (53.5 kg)    LMP 10/10/2012 Comment: States that they just stopped.   SpO2 100%    BMI 20.25 kg/m  Ideal Body Weight: @FLOWAMB ([id number])@  Physical Exam   Constitutional: She is oriented to person, place, and time. She appears well-developed and well-nourished. No distress.  HENT:  Head: Normocephalic and atraumatic.  Right Ear: External ear normal.  Left Ear: External ear normal.  Eyes: Conjunctivae and EOM are normal. Pupils are equal, round, and reactive to light.  Cardiovascular: Normal rate.   Pulmonary/Chest: Effort normal. No respiratory distress.  Neurological: She is alert and oriented to person, place, and time.  Skin: She is not diaphoretic.  Psychiatric: She has a normal mood and affect. Her behavior is normal.     Assessment and Plan: Lori Hutchinson is a 40 y.o. female who is here today for hospitalization follow up. Patient appears to be improving. She is concerned that her body looks "swollen" however this is not totally apparent.  She has good strength.  Vitals wnl.  I will follow up with her pending lab, though her return may likely be within 3-7 days.  Advised not to return to work out at this time.  Advised hydration, nutritious intake, and rest.  She voiced understanding.   Non-traumatic rhabdomyolysis - Plan: CK, COMPLETE METABOLIC PANEL WITH GFR  [name], PA-C [facility] 10/20/201712:48 PM  I personally performed the services described in this documentation, which was scribed in my presence. The recorded information has been reviewed and is accurate.

## 2016-03-30 NOTE — Patient Instructions (Signed)
     IF you received an x-ray today, you will receive an invoice from [facility]. Please contact [facility] at [phone] with questions or concerns regarding your invoice.   IF you received labwork today, you will receive an invoice from [facility]. Please contact [facility] at [phone] with questions or concerns regarding your invoice.   Our billing staff will not be able to assist you with questions regarding bills from these companies.  You will be contacted with the lab results as soon as they are available. The fastest way to get your results is to activate your My Chart account. Instructions are located on the last page of this paperwork. If you have not heard from us regarding the results in 2 weeks, please contact this office.      

## 2016-03-30 NOTE — Telephone Encounter (Signed)
Received call from [facility] lab regarding CK of 11,999.  Upon review of chart, patient currently in ED with repeat CK at 21:19 of 12,129.  No further work up warranted at this time.

## 2016-03-30 NOTE — ED Triage Notes (Signed)
Patient is alert and oriented x4.  She is complaining of right arm swelling that started yesterday.  Patient was recently DC to home yesterday after being DX with rhabdomyolysis and states that her arm started swelling as soon as she got home and her MD told her to come back today.  Currently she rates her pain as 1 of 10.

## 2016-03-30 NOTE — ED Provider Notes (Signed)
[facility] DEPT Provider Note   CSN: [id number] Arrival date & time: 03/30/16  1941     History   Chief Complaint Chief Complaint  Patient presents with  . Arm Injury    swelling    HPI Lori Hutchinson is a 40 y.o. female.  HPI Patient was discharged from the hospital yesterday after rhabdomyolysis from [place]. Reviewing records it appears that her CK started at 22,008 at 25,000 and was 19,000 yesterday. States that she followed up with Dr. day. She states she had some swelling of her muscles but today her right forearm was even more swollen. There is no pain. No numbness or weakness. States she has had several muscle swollen since the training. States she feels fine except feels as if there isn't much fluid in there.   Past Medical History:  Diagnosis Date  . Abnormal Pap smear 1996   Treatment cryosurgery  . Asthma   . Bacterial infection   . H/O borderline personality disorder 12/23/10  . H/O metrorrhagia 06/09/11  . H/O varicella   . Yeast infection     Patient Active Problem List   Diagnosis Date Noted  . Sinus bradycardia   . 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 Anemia 12/20/2011  . Borderline personality disorder 12/20/2011  . 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 PMS (premenstrual syndrome) 12/20/2011    Past Surgical History:  Procedure Laterality Date  . WISDOM TOOTH EXTRACTION      OB History    Gravida Para Term Preterm AB Living   0 0 0 0 0 0   SAB TAB Ectopic Multiple Live Births   0 0 0 0         Home Medications    Prior to Admission medications   Medication Sig Start Date End Date Taking? Authorizing Provider  PROAIR HFA 108 [id number] Base) MCG/ACT inhaler Inhale 2 puffs into the lungs every 4 (four) hours as needed for wheezing or shortness of breath.  02/24/16  Yes Historical Provider, MD    Family History Family History  Problem Relation Age of Onset  . Cancer Maternal Grandmother     Ovarian  . Asthma Mother   .  Heart disease Father   . Cancer Father     Lung  . Cancer Paternal Grandmother     Colon  . Cancer Paternal Grandfather     Colon    Social History Social History  Substance Use Topics  . Smoking status: Former Smoker    Years: 13.00    Quit date: 06/18/2002  . Smokeless tobacco: Never Used  . Alcohol use No     Allergies   Erythromycin; Percocet [oxycodone-acetaminophen]; Shellfish allergy; Iodine; and Latex   Review of Systems Review of Systems  Constitutional: Negative for appetite change.  Respiratory: Negative for shortness of breath.   Cardiovascular: Negative for chest pain.  Gastrointestinal: Negative for abdominal pain.  Genitourinary: Negative for flank pain.  Musculoskeletal: Positive for myalgias.  Skin: Negative for wound.  Neurological: Negative for numbness.  Hematological: Negative for adenopathy.     Physical Exam Updated Vital Signs BP 116/70   Pulse 63   Temp 98.3 F (36.8 C) (Oral)   Resp 18   Ht 5' 4"  (1.626 m)   Wt 117 lb (53.1 kg)   LMP 10/10/2012 Comment: States that they just stopped.  SpO2 97%   BMI 20.08 kg/m   Physical Exam  Constitutional: She appears well-developed.  HENT:  Head: Atraumatic.  Neck: Neck supple.  Cardiovascular: Normal rate.   Pulmonary/Chest: Effort normal.  Abdominal: Soft.  Musculoskeletal:  Some swelling of proximal right forearm. Nontender. No erythema. Neurovascular intact on hand. Strong radial pulse and good capillary refill.  Neurological: She is alert.  Skin: Skin is warm.  Psychiatric: She has a normal mood and affect.     ED Treatments / Results  Labs (all labs ordered are listed, but only abnormal results are displayed) Labs Reviewed  COMPREHENSIVE METABOLIC PANEL - Abnormal; Notable for the following:       Result Value   BUN 23 (*)    AST 357 (*)    ALT 273 (*)    All other components within normal limits  CK - Abnormal; Notable for the following:    Total CK 12,129 (*)    All  other components within normal limits  CBC WITH DIFFERENTIAL/PLATELET    EKG  EKG Interpretation None       Radiology No results found.  Procedures Procedures (including critical care time)  Medications Ordered in ED Medications - No data to display   Initial Impression / Assessment and Plan / ED Course  I have reviewed the triage vital signs and the nursing notes.  Pertinent labs & imaging results that were available during my care of the patient were reviewed by me and considered in my medical decision making (see chart for details).  Clinical Course    Patient presents after recent admission for rhabdomyolysis. Has swelling in forearm. It is not tender or firm. Compartment syndrome felt unlikely. CK is now 12,000 which is decreased from yesterday. Good kidney function. LFTs however mildly elevated. This does not appear to been checked on this last admission. Will need to be followed. Discharge home.  Final Clinical Impressions(s) / ED Diagnoses   Final diagnoses:  Transaminitis  Non-traumatic rhabdomyolysis    New Prescriptions New Prescriptions   No medications on file     Davonna Belling, MD 03/30/16 2319

## 2016-03-30 NOTE — Discharge Instructions (Signed)
Your liver enzymes were mildly elevated will need to be followed. Continue the oral hydration. Watch for increasing pain in the forearm.

## 2016-03-31 NOTE — Telephone Encounter (Signed)
Thank you.  She did not report any issues yesterday that her pain had worsened.  And her exam was normal.

## 2016-04-07 ENCOUNTER — Ambulatory Visit (INDEPENDENT_AMBULATORY_CARE_PROVIDER_SITE_OTHER): Payer: BC Managed Care – PPO | Admitting: Urgent Care

## 2016-04-07 ENCOUNTER — Other Ambulatory Visit: Payer: Self-pay | Admitting: Urgent Care

## 2016-04-07 VITALS — BP 100/64 | HR 64 | Temp 98.3°F | Resp 16 | Ht 64.0 in | Wt 114.6 lb

## 2016-04-07 DIAGNOSIS — R748 Abnormal levels of other serum enzymes: Secondary | ICD-10-CM | POA: Diagnosis not present

## 2016-04-07 DIAGNOSIS — E860 Dehydration: Secondary | ICD-10-CM | POA: Diagnosis not present

## 2016-04-07 DIAGNOSIS — M6282 Rhabdomyolysis: Secondary | ICD-10-CM

## 2016-04-07 LAB — BASIC METABOLIC PANEL
BUN: 20 mg/dL (ref 7–25)
CHLORIDE: 103 mmol/L (ref 98–110)
CO2: 26 mmol/L (ref 20–31)
Calcium: 9.4 mg/dL (ref 8.6–10.2)
Creat: 1 mg/dL (ref 0.50–1.10)
Glucose, Bld: 65 mg/dL (ref 65–99)
POTASSIUM: 4 mmol/L (ref 3.5–5.3)
Sodium: 139 mmol/L (ref 135–146)

## 2016-04-07 NOTE — Patient Instructions (Addendum)
Rhabdomyolysis Rhabdomyolysis is a condition that results when muscle cells break down and release substances into the blood that can damage the kidneys. It happens because of damage to the muscles that move bones (skeletal muscle). When you damage this type of muscle, substances inside of your muscle cells are released into your blood. This includes a certain protein called myoglobin. Your kidneys must filter myoglobin from your blood. Large amounts of myoglobin can cause kidney damage or kidney failure. Other substances that are released by muscle cells may upset the balance of the minerals (electrolytes) in your blood. This makes your blood become too acidic (acidosis). CAUSES This condition is caused by muscle damage. Muscle damage often results from:  Extreme overuse of the muscles.  An injury that crushes or compresses a muscle.  Use of illegal drugs, especially cocaine.  Alcohol abuse. Other possible causes include:  Prescription medicines, such as statins, amphetamines, and opiates.  Infections.  Inherited muscle diseases.  High fever.  Heatstroke.  Dehydration.  Seizures.  Surgery. RISK FACTORS This condition is more likely to develop in:  People who have a family history of muscle disease.  People who participate in extreme sports, such as marathon running.  People who have diabetes.  Older people.  People who abuse drugs or alcohol. SYMPTOMS Symptoms of this condition vary. Some people have very few symptoms, while others have many symptoms. The most common symptoms include:  Muscle pain and swelling.  Muscle weakness.  Dark urine.  Feeling weak and tired. Other symptoms include:  Nausea and vomiting.  Fever.  Pain in the abdomen.  Joint pain. Signs and symptoms of complications from rhabdomyolysis may include:  Heart rhythm abnormalities (arrhythmias).  Seizures.  Reduced urine production because of kidney failure.  Very low blood  pressure (shock).  Uncontrolled bleeding. DIAGNOSIS This condition may be diagnosed based on:  Your symptoms and medical history.  A physical exam.  Blood tests to check for:  Muscle breakdown products in the blood (creatine kinase).  Myoglobin.  Acidosis.  Electrolyte imbalances.  Urine tests to check for myoglobin. You may also have other tests to check for causes of muscle damage and to check for complications. TREATMENT Treatment for this condition focuses on keeping up your fluid level, reversing acidosis, and protecting your kidneys. Treatment may include:  Fluids and medicines given through an IV tube that is inserted into one of your veins.  Medicines, such as:  Sodium bicarbonate to reduce acidosis.  Electrolytes to restore the balance of these minerals in your body.  Hemodialysis. This treatment uses an artificial kidney machine to filter your blood while you recover. You may have this if other treatments are not helping. HOME CARE INSTRUCTIONS  Take medicines only as directed by your health care provider.  Rest at home until your health care provider says that you can return to your normal activities.  Drink enough fluid to keep your urine clear or pale yellow.  Do not exercise with great energy and effort (strenuously). Ask your health care provider what level of exercise is safe for you.  Do not abuse drugs or alcohol. If you are struggling with drug or alcohol use, ask your health care provider for help.  Keep all follow-up visits as directed by your health care provider. This is important. SEEK MEDICAL CARE IF:  You develop symptoms of rhabdomyolysis at home after treatment. SEEK IMMEDIATE MEDICAL CARE IF:  You have a seizure.  You bleed easily or cannot control bleeding.  You  cannot make urine.  You have chest pain.  You have trouble breathing.   This information is not intended to replace advice given to you by your health care provider.  Make sure you discuss any questions you have with your health care provider.   Document Released: 05/11/2004 Document Revised: 10/13/2014 Document Reviewed: 06/03/2014 Elsevier Interactive Patient Education 2016 [employer].     IF you received an x-ray today, you will receive an invoice from [facility]. Please contact [facility] at [phone] with questions or concerns regarding your invoice.   IF you received labwork today, you will receive an invoice from [employer]. Please contact [facility] at [phone] with questions or concerns regarding your invoice.   Our billing staff will not be able to assist you with questions regarding bills from these companies.  You will be contacted with the lab results as soon as they are available. The fastest way to get your results is to activate your My Chart account. Instructions are located on the last page of this paperwork. If you have not heard from [place] regarding the results in 2 weeks, please contact this office.

## 2016-04-07 NOTE — Progress Notes (Signed)
° °  By signing my name below, I, [name], attest that this documentation has been prepared under the direction and in the presence of Jaynee Eagles, [place].  Electronically Signed: [name], Medical Scribe. 04/07/16. 2:40 PM.  MRN: [id number] DOB: 08/27/1975  Subjective:   Lori Hutchinson is a 40 y.o. female presenting for chief complaint of CK level follow-up. Pt was in the hospital for rhabdomyolysis after drinking a lot of energy drinks, working out a lot, and not hydrating well. Had lower arm swelling last week before she went to the hospital due to blood draw. When she recently straightened her hair, it unusually felt like a workout to her. Pt has been hydrating well and cut off her energy drinks. Denies fever, abdominal pain, flank pain, nausea and vomiting. She is waiting for a healthcare provider to give her an okay to return to her exercise.   Current Meds  Medication Sig   PROAIR HFA 108 (90 Base) MCG/ACT inhaler Inhale 2 puffs into the lungs every 4 (four) hours as needed for wheezing or shortness of breath.    Amar is allergic to erythromycin; percocet [oxycodone-acetaminophen]; shellfish allergy; iodine; and latex.  Duanna  has a past medical history of Abnormal Pap smear (1996); Asthma; Bacterial infection; H/O borderline personality disorder (12/23/10); H/O metrorrhagia (06/09/11); H/O varicella; and Yeast infection. Also  has a past surgical history that includes Wisdom tooth extraction.  Objective:   Vitals: BP 100/64 (BP Location: Right Arm, Patient Position: Sitting, Cuff Size: Normal)    Pulse 64    Temp 98.3 F (36.8 C) (Oral)    Resp 16    Ht 5' 4"  (1.626 m)    Wt 114 lb 9.6 oz (52 kg)    LMP 10/10/2012 Comment: States that they just stopped.   SpO2 97%    BMI 19.67 kg/m   Physical Exam  Constitutional: She is oriented to person, place, and time. She appears well-developed and well-nourished.  HENT:  Mouth/Throat: Oropharynx is clear and moist.  Eyes: No scleral  icterus.  Cardiovascular: Normal rate, regular rhythm and intact distal pulses.  Exam reveals no gallop and no friction rub.   No murmur heard. Pulmonary/Chest: No respiratory distress. She has no wheezes. She has no rales.  Abdominal: Soft. Bowel sounds are normal. She exhibits no distension and no mass. There is no tenderness. There is no guarding.  Neurological: She is alert and oriented to person, place, and time.  Skin: Skin is warm and dry.   Assessment and Plan :   1. Non-traumatic rhabdomyolysis 2. Elevated CK 3. Dehydration - Labs pending, discussed patient's careful return to her exercise. She verbalizes understanding and is working with trainers that will tailor her work outs taking into account her recent episode of rhabdomyolysis.  Jaynee Eagles, PA-C [facility] [phone] 04/07/2016 2:40 PM

## 2016-04-08 ENCOUNTER — Other Ambulatory Visit: Payer: Self-pay

## 2016-04-08 LAB — CK: CK TOTAL: 247 U/L — AB (ref 7–177)

## 2016-04-09 ENCOUNTER — Encounter: Payer: Self-pay | Admitting: Urgent Care

## 2016-04-09 LAB — HEPATITIS B SURFACE ANTIGEN: HEPATITIS B SURFACE ANTIGEN: NEGATIVE

## 2016-04-09 LAB — HEPATITIS B SURFACE ANTIBODY,QUALITATIVE: Hep B S Ab: NEGATIVE

## 2016-04-09 LAB — HEPATITIS C ANTIBODY: HCV Ab: NEGATIVE

## 2016-04-11 ENCOUNTER — Telehealth: Payer: Self-pay

## 2016-04-11 NOTE — Telephone Encounter (Signed)
Patient is following up to see if there is a note ready in regards to her workout restrictions. This was recently request in the [place] message sent to [facility]. Please advise! [phone]

## 2016-04-12 ENCOUNTER — Encounter: Payer: Self-pay | Admitting: Family Medicine

## 2016-04-12 ENCOUNTER — Ambulatory Visit (INDEPENDENT_AMBULATORY_CARE_PROVIDER_SITE_OTHER): Payer: BC Managed Care – PPO | Admitting: Family Medicine

## 2016-04-12 ENCOUNTER — Telehealth: Payer: Self-pay | Admitting: Family Medicine

## 2016-04-12 VITALS — BP 104/78 | HR 52 | Temp 97.6°F | Resp 16 | Wt 117.8 lb

## 2016-04-12 DIAGNOSIS — R5383 Other fatigue: Secondary | ICD-10-CM

## 2016-04-12 DIAGNOSIS — R748 Abnormal levels of other serum enzymes: Secondary | ICD-10-CM | POA: Diagnosis not present

## 2016-04-12 DIAGNOSIS — R42 Dizziness and giddiness: Secondary | ICD-10-CM

## 2016-04-12 DIAGNOSIS — Z8739 Personal history of other diseases of the musculoskeletal system and connective tissue: Secondary | ICD-10-CM | POA: Diagnosis not present

## 2016-04-12 LAB — CBC WITH DIFFERENTIAL/PLATELET
BASOS PCT: 1 %
Basophils Absolute: 41 cells/uL (ref 0–200)
EOS ABS: 82 {cells}/uL (ref 15–500)
Eosinophils Relative: 2 %
HEMATOCRIT: 39.3 % (ref 35.0–45.0)
Hemoglobin: 13.7 g/dL (ref 11.7–15.5)
LYMPHS PCT: 24 %
Lymphs Abs: 984 cells/uL (ref 850–3900)
MCH: 32.4 pg (ref 27.0–33.0)
MCHC: 34.9 g/dL (ref 32.0–36.0)
MCV: 92.9 fL (ref 80.0–100.0)
MONO ABS: 451 {cells}/uL (ref 200–950)
MPV: 9.2 fL (ref 7.5–12.5)
Monocytes Relative: 11 %
NEUTROS ABS: 2542 {cells}/uL (ref 1500–7800)
Neutrophils Relative %: 62 %
Platelets: 243 10*3/uL (ref 140–400)
RBC: 4.23 MIL/uL (ref 3.80–5.10)
RDW: 12 % (ref 11.0–15.0)
WBC: 4.1 10*3/uL (ref 3.8–10.8)

## 2016-04-12 LAB — COMPREHENSIVE METABOLIC PANEL
ALBUMIN: 4.5 g/dL (ref 3.6–5.1)
ALT: 78 U/L — ABNORMAL HIGH (ref 6–29)
AST: 39 U/L — ABNORMAL HIGH (ref 10–30)
Alkaline Phosphatase: 65 U/L (ref 33–115)
BUN: 16 mg/dL (ref 7–25)
CHLORIDE: 102 mmol/L (ref 98–110)
CO2: 26 mmol/L (ref 20–31)
CREATININE: 0.85 mg/dL (ref 0.50–1.10)
Calcium: 9.4 mg/dL (ref 8.6–10.2)
GLUCOSE: 74 mg/dL (ref 65–99)
Potassium: 4.6 mmol/L (ref 3.5–5.3)
SODIUM: 137 mmol/L (ref 135–146)
Total Bilirubin: 0.7 mg/dL (ref 0.2–1.2)
Total Protein: 6.9 g/dL (ref 6.1–8.1)

## 2016-04-12 LAB — POCT UA - MICROSCOPIC ONLY: MUCUS UA: ABSENT

## 2016-04-12 LAB — POCT URINALYSIS DIPSTICK
BILIRUBIN UA: NEGATIVE
GLUCOSE UA: NEGATIVE
Ketones, UA: NEGATIVE
Leukocytes, UA: NEGATIVE
Nitrite, UA: NEGATIVE
Protein, UA: NEGATIVE
SPEC GRAV UA: 1.01
UROBILINOGEN UA: 0.2
pH, UA: 7

## 2016-04-12 LAB — CK: CK TOTAL: 123 U/L (ref 7–177)

## 2016-04-12 LAB — HIV ANTIBODY (ROUTINE TESTING W REFLEX): HIV 1&2 Ab, 4th Generation: NONREACTIVE

## 2016-04-12 NOTE — Telephone Encounter (Signed)
Discussed lab results with patient via phone.  Labs looking good.  CK normalized.  LFTs improving.  Recommended continued hydration and return to clinic if persistent symptoms.  Deakin Lacek M. Lajuana Ripple, DO PGY-3, [facility]

## 2016-04-12 NOTE — Telephone Encounter (Signed)
I had already replied to the patient through mychart. But please place the following in a letter if Lori Hutchinson insists on having a letter.  "The safest thing to do would be to start exercise at 10 minutes of light cardio. Gradually increase this every day thereafter by 10 minutes only until you get to 40 minutes. Stay at this level for 2 weeks. After those 2 weeks, you can go up to one hour. No resistance training or cross-fit training for at least 1 month. You should monitor and limit your level of exercise based on your symptoms of rhabdomyolysis. Generally these symptoms are fatigue, muscle aching, tightness or swelling. If these symptoms recur, you need to come back to clinic immediately."  Thank you!

## 2016-04-12 NOTE — Progress Notes (Signed)
Subjective: CC: Dizziness, fatigue LMB:[id number] Lori Hutchinson is a 40 y.o. female presenting to clinic today for same day appointment. PCP: [name], MD Concerns today include:  1. Dizziness and fatigue Patient reports that she woke up with dizziness and fatigue this morning.  She notes that her symptoms feel very similar to when she was diagnosed and hospitalized for rhabdo.  She reports that she was not hydrating well yesterday.  She notes that she worked 14 hours yesterday and does not think that she hydrated well.  She reports that so far this morning, she has had 4 glasses of water and 2 vitamin waters.  She notes that she is urinating normally.  She reports urine is slightly darker than yesterday.  No coffee or energy drinks yesterday.  She is tolerating food normally.  Denies abdominal pain.  Reports some back pain (chronic).  No dysuria, fevers, chills, nausea, vomiting.  Does not take medication.  Last time she exercised was the day she went into the hospital.  Has not exercised recently.  LMP >3 years ago.  ROS: Per HPI Social History Reviewed: former smoker. FamHx and MedHx reviewed.    Family History  Problem Relation Age of Onset  . Cancer Maternal Grandmother     Ovarian  . Asthma Mother   . Heart disease Father   . Cancer Father     Lung  . Cancer Paternal Grandmother     Colon  . Cancer Paternal Grandfather     Colon   Past Medical History:  Diagnosis Date  . Abnormal Pap smear 1996   Treatment cryosurgery  . Asthma   . Bacterial infection   . H/O borderline personality disorder 12/23/10  . H/O metrorrhagia 06/09/11  . H/O varicella   . Yeast infection    Past Surgical History:  Procedure Laterality Date  . WISDOM TOOTH EXTRACTION     Allergies  Allergen Reactions  . Erythromycin Nausea And Vomiting  . Percocet [Oxycodone-Acetaminophen] Nausea And Vomiting  . Shellfish Allergy Nausea And Vomiting    Stomach burning-- pt was unable to eat for days  following reaction   . Iodine Rash  . Latex Rash    Objective: Office vital signs reviewed. BP 104/78 (Patient Position: Sitting, Cuff Size: Normal)   Pulse (!) 52   Temp 97.6 F (36.4 C) (Oral)   Resp 16   Wt 117 lb 12.8 oz (53.4 kg)   LMP 10/10/2012 Comment: States that they just stopped.  SpO2 100%   BMI 20.22 kg/m   Physical Examination:  General: Awake, alert, thin female, pale appearing, No acute distress HEENT: Normal    Eyes: PERRLA, EOMI, sclera white    Throat: moist mucus membranes Cardio: regular rate and rhythm, S1S2 heard, no murmurs appreciated Pulm: clear to auscultation bilaterally, no wheezes, rhonchi or rales, normal WOB on room air GI: soft, non-tender, non-distended, bowel sounds present x4, no hepatomegaly, no splenomegaly MSK: Normal gait and station Neuro: Strength and sensation grossly intact, no nystagmus on exam, follows commands, no focal deficits  Results for orders placed or performed in visit on 04/12/16 (from the past 24 hour(s))  POCT urinalysis dipstick     Status: None   Collection Time: 04/12/16 12:30 PM  Result Value Ref Range   Color, UA yellow'    Clarity, UA clear    Glucose, UA neg    Bilirubin, UA neg    Ketones, UA neg    Spec Grav, UA 1.010    Blood, UA  moderate    pH, UA 7.0    Protein, UA neg    Urobilinogen, UA 0.2    Nitrite, UA neg    Leukocytes, UA Negative Negative  POCT UA - Microscopic Only     Status: None   Collection Time: 04/12/16 12:34 PM  Result Value Ref Range   WBC, Ur, HPF, POC none    RBC, urine, microscopic none    Bacteria, U Microscopic none    Mucus, UA absent    Epithelial cells, urine per micros none    Crystals, Ur, HPF, POC none    Casts, Ur, LPF, POC none    Yeast, UA none      Assessment/ Plan: 40 y.o. female   1. Fatigue, unspecified type.  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  No evidence of URI/ viral process on exam/ HPI.  TSH within normal limits 03/2016.  No active bleeding.   Not on menstrual cycle.  Has history of anorexia nervosa.  VS stable. - Consider vitamin deficiency. - Encourage fluid hydration - Comprehensive metabolic panel - POCT urinalysis dipstick - CK - CBC With Differential - POCT UA - Microscopic Only - HIV antibody (with reflex) - Pain Mgmt Prof 3 w/Conf w/o mM, U - Will contact with results of labs and instructions  2. Dizziness.  Could be secondary to dehydration but given h/o rhabdo, will evaluate further with labs.  No nystagmus on exam.  No focal neurologic deficits.  VS stable. - Comprehensive metabolic panel - POCT urinalysis dipstick - CK - CBC With Differential - POCT UA - Microscopic Only - HIV antibody (with reflex) - Pain Mgmt Prof 3 w/Conf w/o mM, U  3. History of rhabdomyolysis.  UA with "blood" detected but no RBCs suggesting myoglobin, consistent with rhabdo/ muscle breakdown. - Comprehensive metabolic panel - POCT urinalysis dipstick - CK - CBC With Differential - POCT UA - Microscopic Only - HIV antibody (with reflex) - Pain Mgmt Prof 3 w/Conf w/o mM, U - Will contact with lab results.  Patient notes it is OK to leave voicemail. - If elevated CK, will need to refer to ED/ hospital for management.  4.  Elevated LFTs: persistently elevated.  Hep B and C negative.  No hepatosplenomegaly or masses on exam. - Obtain HIV test - Consider ultrasound of liver  Lori Windell Moulding, DO PGY-3, [facility]

## 2016-04-12 NOTE — Patient Instructions (Addendum)
I am obtaining labs to check your liver and kidney function.  I am also checking your CK level to look to see if the rhabdo is getting worse.  I encourage you to drink plenty of fluids.  We will call you with the results of these tests.  Rhabdomyolysis Rhabdomyolysis is a condition that results when muscle cells break down and release substances into the blood that can damage the kidneys. It happens because of damage to the muscles that move bones (skeletal muscle). When you damage this type of muscle, substances inside of your muscle cells are released into your blood. This includes a certain protein called myoglobin. Your kidneys must filter myoglobin from your blood. Large amounts of myoglobin can cause kidney damage or kidney failure. Other substances that are released by muscle cells may upset the balance of the minerals (electrolytes) in your blood. This makes your blood become too acidic (acidosis). CAUSES This condition is caused by muscle damage. Muscle damage often results from:  Extreme overuse of the muscles.  An injury that crushes or compresses a muscle.  Use of illegal drugs, especially cocaine.  Alcohol abuse. Other possible causes include:  Prescription medicines, such as statins, amphetamines, and opiates.  Infections.  Inherited muscle diseases.  High fever.  Heatstroke.  Dehydration.  Seizures.  Surgery. RISK FACTORS This condition is more likely to develop in:  People who have a family history of muscle disease.  People who participate in extreme sports, such as marathon running.  People who have diabetes.  Older people.  People who abuse drugs or alcohol. SYMPTOMS Symptoms of this condition vary. Some people have very few symptoms, while others have many symptoms. The most common symptoms include:  Muscle pain and swelling.  Muscle weakness.  Dark urine.  Feeling weak and tired. Other symptoms include:  Nausea and  vomiting.  Fever.  Pain in the abdomen.  Joint pain. Signs and symptoms of complications from rhabdomyolysis may include:  Heart rhythm abnormalities (arrhythmias).  Seizures.  Reduced urine production because of kidney failure.  Very low blood pressure (shock).  Uncontrolled bleeding. DIAGNOSIS This condition may be diagnosed based on:  Your symptoms and medical history.  A physical exam.  Blood tests to check for:  Muscle breakdown products in the blood (creatine kinase).  Myoglobin.  Acidosis.  Electrolyte imbalances.  Urine tests to check for myoglobin. You may also have other tests to check for causes of muscle damage and to check for complications. TREATMENT Treatment for this condition focuses on keeping up your fluid level, reversing acidosis, and protecting your kidneys. Treatment may include:  Fluids and medicines given through an IV tube that is inserted into one of your veins.  Medicines, such as:  Sodium bicarbonate to reduce acidosis.  Electrolytes to restore the balance of these minerals in your body.  Hemodialysis. This treatment uses an artificial kidney machine to filter your blood while you recover. You may have this if other treatments are not helping. HOME CARE INSTRUCTIONS  Take medicines only as directed by your health care provider.  Rest at home until your health care provider says that you can return to your normal activities.  Drink enough fluid to keep your urine clear or pale yellow.  Do not exercise with great energy and effort (strenuously). Ask your health care provider what level of exercise is safe for you.  Do not abuse drugs or alcohol. If you are struggling with drug or alcohol use, ask your health care provider for  help.  Keep all follow-up visits as directed by your health care provider. This is important. SEEK MEDICAL CARE IF:  You develop symptoms of rhabdomyolysis at home after treatment. SEEK IMMEDIATE MEDICAL  CARE IF:  You have a seizure.  You bleed easily or cannot control bleeding.  You cannot make urine.  You have chest pain.  You have trouble breathing.   This information is not intended to replace advice given to you by your health care provider. Make sure you discuss any questions you have with your health care provider.   Document Released: 05/11/2004 Document Revised: 10/13/2014 Document Reviewed: 06/03/2014 Elsevier Interactive Patient Education 2016 [employer].     IF you received an x-ray today, you will receive an invoice from [facility]. Please contact [facility] at [phone] with questions or concerns regarding your invoice.   IF you received labwork today, you will receive an invoice from [employer]. Please contact [facility] at [phone] with questions or concerns regarding your invoice.   Our billing staff will not be able to assist you with questions regarding bills from these companies.  You will be contacted with the lab results as soon as they are available. The fastest way to get your results is to activate your My Chart account. Instructions are located on the last page of this paperwork. If you have not heard from [place] regarding the results in 2 weeks, please contact this office.

## 2016-04-13 LAB — PAIN MGMT PROF 3 W/CONF W/O MM, U
AMPHETAMINES: NEGATIVE ng/mL (ref <500)
Benzodiazepines: NEGATIVE ng/mL (ref <100)
COCAINE METABOLITE: NEGATIVE ng/mL (ref <150)
CREATININE: 11.1 mg/dL — AB (ref >=20.0)
Marijuana Metabolite: NEGATIVE ng/mL (ref <20)
OPIATES: NEGATIVE ng/mL (ref <100)
OXYCODONE: NEGATIVE ng/mL (ref <100)
Oxidant: NEGATIVE ug/mL (ref <200)
Specific Gravity: 1.004 (ref >=1.003)
pH: 6.92 (ref 4.5–9.0)

## 2016-04-13 NOTE — Telephone Encounter (Signed)
Pt acknowledged my email and will p/up letter this afternoon.

## 2016-04-13 NOTE — Telephone Encounter (Signed)
Wrote the letter and will leave in p/up drawer. Tried to call pt, but kept getting a fast busy signal. Will try later, but also send a MyChart message since she has also been contacting us through email.

## 2016-04-24 ENCOUNTER — Encounter: Payer: Self-pay | Admitting: Urgent Care

## 2016-04-25 ENCOUNTER — Encounter: Payer: Self-pay | Admitting: Family Medicine

## 2016-04-26 NOTE — Telephone Encounter (Signed)
Dr Carlota Raspberry, I replied to pt to let her know you'd be happy to see her and to call to set up appt. Forwarding her message on to you.

## 2016-05-01 ENCOUNTER — Ambulatory Visit (INDEPENDENT_AMBULATORY_CARE_PROVIDER_SITE_OTHER): Payer: BC Managed Care – PPO | Admitting: Family Medicine

## 2016-05-01 VITALS — BP 116/68 | HR 65 | Temp 98.2°F | Resp 18 | Ht 64.0 in | Wt 114.8 lb

## 2016-05-01 DIAGNOSIS — R42 Dizziness and giddiness: Secondary | ICD-10-CM | POA: Diagnosis not present

## 2016-05-01 DIAGNOSIS — M6282 Rhabdomyolysis: Secondary | ICD-10-CM

## 2016-05-01 DIAGNOSIS — F411 Generalized anxiety disorder: Secondary | ICD-10-CM

## 2016-05-01 DIAGNOSIS — R7989 Other specified abnormal findings of blood chemistry: Secondary | ICD-10-CM

## 2016-05-01 DIAGNOSIS — R945 Abnormal results of liver function studies: Secondary | ICD-10-CM

## 2016-05-01 DIAGNOSIS — R5383 Other fatigue: Secondary | ICD-10-CM

## 2016-05-01 LAB — CBC WITH DIFFERENTIAL/PLATELET
BASOS ABS: 37 {cells}/uL (ref 0–200)
Basophils Relative: 1 %
EOS ABS: 37 {cells}/uL (ref 15–500)
EOS PCT: 1 %
HCT: 42.4 % (ref 35.0–45.0)
HEMOGLOBIN: 14.3 g/dL (ref 11.7–15.5)
LYMPHS PCT: 29 %
Lymphs Abs: 1073 cells/uL (ref 850–3900)
MCH: 32.1 pg (ref 27.0–33.0)
MCHC: 33.7 g/dL (ref 32.0–36.0)
MCV: 95.1 fL (ref 80.0–100.0)
MONO ABS: 296 {cells}/uL (ref 200–950)
MONOS PCT: 8 %
MPV: 10 fL (ref 7.5–12.5)
NEUTROS ABS: 2257 {cells}/uL (ref 1500–7800)
Neutrophils Relative %: 61 %
PLATELETS: 240 10*3/uL (ref 140–400)
RBC: 4.46 MIL/uL (ref 3.80–5.10)
RDW: 12.9 % (ref 11.0–15.0)
WBC: 3.7 10*3/uL — ABNORMAL LOW (ref 3.8–10.8)

## 2016-05-01 LAB — CK: CK TOTAL: 72 U/L (ref 7–177)

## 2016-05-01 LAB — COMPLETE METABOLIC PANEL WITH GFR
ALT: 49 U/L — AB (ref 6–29)
AST: 30 U/L (ref 10–30)
Albumin: 4.6 g/dL (ref 3.6–5.1)
Alkaline Phosphatase: 61 U/L (ref 33–115)
BUN: 16 mg/dL (ref 7–25)
CHLORIDE: 106 mmol/L (ref 98–110)
CO2: 29 mmol/L (ref 20–31)
CREATININE: 0.87 mg/dL (ref 0.50–1.10)
Calcium: 9.6 mg/dL (ref 8.6–10.2)
GFR, EST NON AFRICAN AMERICAN: 84 mL/min (ref >=60)
GLUCOSE: 81 mg/dL (ref 65–99)
Potassium: 4.2 mmol/L (ref 3.5–5.3)
SODIUM: 141 mmol/L (ref 135–146)
TOTAL PROTEIN: 7.2 g/dL (ref 6.1–8.1)
Total Bilirubin: 0.6 mg/dL (ref 0.2–1.2)

## 2016-05-01 NOTE — Patient Instructions (Addendum)
I would consider meeting with other counselor locally or updating your prior counselor with current concerns. Let me know if you need other numbers.  Based on your recent symptoms after workout last week - I want you to rest this week and I will look into appropriate workup for possible underlying myopathy.  Once we determine next step - can decide on repeat testing interval as well as exercise regimen form here forward.   I will check your liver tests, blood count and CPK level again today. Make sure you're drinking plenty of water throughout the day, regular meals throughout the day.       IF you received an x-ray today, you will receive an invoice from [facility]. Please contact [facility] at [phone] with questions or concerns regarding your invoice.   IF you received labwork today, you will receive an invoice from [employer]. Please contact [facility] at [phone] with questions or concerns regarding your invoice.   Our billing staff will not be able to assist you with questions regarding bills from these companies.  You will be contacted with the lab results as soon as they are available. The fastest way to get your results is to activate your My Chart account. Instructions are located on the last page of this paperwork. If you have not heard from [place] regarding the results in 2 weeks, please contact this office.

## 2016-05-01 NOTE — Progress Notes (Signed)
Subjective:    Patient ID: Lori Hutchinson, female    DOB: [birth year], 40 y.o.   MRN: [id number]  HPI 40 year old here for follow-up of rhabdomyolysis. She was initially seen on October 12th with right arm pain after pull-ups at cross fit.  Had been traveling with drinking soda, energy drinks, coffee - more than usual with traveling. Usually doesn't drink much water - about 8 ounces per day. Limiting calories to 900 calories per day for years, but had been increasing to 12102m.day to help build strength. Week prior to traveling had multiple Crossfit workouts, as had been sick with bronchitis the month prior. Felt ok on those workouts, but more sore than usual. When traveling the week prior to OV here - noticed more soreness with clean and jerks, squats and weightlifting. Seemed to me more sore than usual for that intensity of workout.  Crossfit on 03/22/16 - had pain during warm up, arms wouldn't straighten. Seen here 03/23/16 with subjective swelling of her biceps and stiffness into right arm and hand, trouble straightening arm.  Saw orthopaedist following day for already scheduled appt - trouble with shoulder exam that day.  Tx; ice heat, treat stiff muscles, with plan for improvement in 72 hours.    ON 10/14 - woke up 3 pounds heavier from day before, so went to gym, worked out.  Felt awful after working out - tired, arms in pain, both sides now.    Seen in ER that night - admitted October 14 through the 18th for rhabdomyolysis with initial CPK of 15,702 and a serum myoglobin of 1441. CPK increased to 16,994 on the 15th, then 22,312 on the 16th. Stabilized on the 17th to 21,745, then back up to 25,958 on October 17th.  Renal function was maintained with creatinine 0.75-0.82 during the hospitalization. UDS was negative. She was treated with IV fluids in hospital - gained 15 pounds with water weight.   She was seen in follow-up here on October 19th. CPK was down to 11,999 at that time.  She was seen  later that evening in the emergency department on October 19th as R forearm started to swell. CPK 12,129. Transaminitis was noted at that ER visit. Discharged home with follow-up outpatient.   Seen in follow-up again on November 1st. Had not yet returned to exercise. Dizziness and fatigue that day.  Had been having those symptoms off and on.  with concerns for repeat rhabdo. Did admit to not hydrating well day prior, and had also worked 14 hour day day prior. At that visit her LFTs were improving with AST 357 down to 39 and ALT 273 down to 78. CPK was also improving with reading of 247 on October 27 and 123 on November 1. She had a normal CBC at that time. Drug screen again negative, urinalysis negative for proteinuria.  Started back to exercise 2 weeks ago - initially stretches, 10 mins on bike.  About 10 days ago - did some armwork with handstands, vinyasa flows, forearm and head stands. No pain at the time - similar pain as prior next day - lasted 72 hours. No swelling noted, and able to straighten arm.   Over past 3 weeks - still doesn't feel well.  Has been doing some stretching, about 15 mins on bike. Felt dizzy, not well after bike. Took branched chain amino acids (just started this week), increased water intake, soak in bath and ate dinner that night. More sore 2 days ago after harder ride on bike day  prior. More dizzy and tired 2 days ago as well.  Feels better today, but still does not feel normal. Some trouble with concentration, but feeling anxious with these events. Trying meditation. Worried anytime muscles are sore. Had been meeting with counselor - Collier Salina in the past.   Diet: 1200kcal per day, water - usually about 2 liters or more per day, maybe a little less on Tuesday.  Frequent small meals per day. No current muscle pain. No dark urine (not noticed in past). Monster energy drinks - 1 large can per day, 5 cups of coffee per day and energy gum/chocolates, sodas for week prior to  [place].    Supplements:  L-carnitine and CLA few days per week for 1-2 weeks prior to onset of sx's.  Has been using BCAA with every workout for years. No other supplements.   She has been researching Rhabdo - wondered about medication to use if occurs again, and if other testing needed.   Patient Active Problem List   Diagnosis Date Noted  . Sinus bradycardia   . 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 Anemia 12/20/2011  . Borderline personality disorder 12/20/2011  . 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 PMS (premenstrual syndrome) 12/20/2011   Past Medical History:  Diagnosis Date  . Abnormal Pap smear 1996   Treatment cryosurgery  . Asthma   . Bacterial infection   . H/O borderline personality disorder 12/23/10  . H/O metrorrhagia 06/09/11  . H/O varicella   . Yeast infection    Past Surgical History:  Procedure Laterality Date  . WISDOM TOOTH EXTRACTION     Allergies  Allergen Reactions  . Erythromycin Nausea And Vomiting  . Percocet [Oxycodone-Acetaminophen] Nausea And Vomiting  . Shellfish Allergy Nausea And Vomiting    Stomach burning-- pt was unable to eat for days following reaction   . Iodine Rash  . Latex Rash   Prior to Admission medications   Medication Sig Start Date End Date Taking? Authorizing Provider  PROAIR HFA 108 [phone] Base) MCG/ACT inhaler Inhale 2 puffs into the lungs every 4 (four) hours as needed for wheezing or shortness of breath.  02/24/16  Yes Historical Provider, MD   Social History   Social History  . Marital status: Single    Spouse name: N/A  . Number of children: N/A  . Years of education: N/A   Occupational History  . Not on file.   Social History Main Topics  . Smoking status: Former Smoker    Years: 13.00    Quit date: 06/18/2002  . Smokeless tobacco: Never Used  . Alcohol use No  . Drug use: No  . Sexual activity: Yes    Partners: Male     Comment: lo loestrin   Other Topics Concern  . Not on file   Social  History Narrative  . No narrative on file      Review of Systems  Constitutional: Positive for fatigue and unexpected weight change. Negative for fever.  Genitourinary: Negative for decreased urine volume and difficulty urinating.  Musculoskeletal: Positive for myalgias (as in hpi. ).  Neurological: Negative for weakness.       Objective:   Physical Exam  Constitutional: She is oriented to person, place, and time. She appears well-developed and well-nourished. No distress.  HENT:  Head: Normocephalic and atraumatic.  Cardiovascular: Normal rate.   Pulmonary/Chest: Effort normal.  Musculoskeletal: Normal range of motion. She exhibits no edema or deformity.  No focal muscle tenderness. Full range of  motion of elbows,  and lower extremities. Strength equal and intact in upper extremities and lower extremities bilaterally.  Neurological: She is alert and oriented to person, place, and time.  Skin: Skin is warm and dry.  Psychiatric: She has a normal mood and affect. Her speech is normal. Judgment normal. She expresses no suicidal ideation.  Vitals reviewed.  Vitals:   05/01/16 1238  BP: 116/68  Pulse: 65  Resp: 18  Temp: 98.2 F (36.8 C)  TempSrc: Oral  SpO2: 99%  Weight: 114 lb 12.8 oz (52.1 kg)  Height: 5' 4"  (1.626 m)     Wt Readings from Last 3 Encounters:  05/01/16 114 lb 12.8 oz (52.1 kg)  04/12/16 117 lb 12.8 oz (53.4 kg)  04/07/16 114 lb 9.6 oz (52 kg)  over 30 minutes face to face care and reviewing prior records.     Assessment & Plan:  Lori Hutchinson is a 40 y.o. female Non-traumatic rhabdomyolysis - Plan: CK, COMPLETE METABOLIC PANEL WITH GFR Fatigue, unspecified type - Plan: COMPLETE METABOLIC PANEL WITH GFR, CBC with Differential/Platelet Elevated LFTs - Plan: COMPLETE METABOLIC PANEL WITH GFR  - on discussion of events prior to rhabdo, suspected overuse/increased muscle work with relatively dehydrated state after excessive caffeine intake, resulting in  exertional rhabdomyolysis.  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 However, with degree of rhabdo and possible mild recurrence of sx's subsequently, will look into other testing or consult with other specialist.   -  Hx of eating disorder, but had been working on other method of exercise at recommendation of her therapist with decreased body image concern.   -with recurrence of sx's with exercise (appears to be more than delayed onset muscle soreness) advised rest for now and very minimal activity until further eval/testing known. Eventually can discuss regimen of return to exercise.   - advised to maintain hydration, decrease caffeinated drinks in the meantime.   -repeat LFT's, but downtrending prior.    Dizziness - Plan: CBC with Differential/Platelet  - check CMP, CBC, CPK. Maintain adequate hydration as above.   Anxiety state  - underlying anxiety - worsened with rhabdo and concern of return to exercise. Recommended meeting back with prior counselor, but may need to meet with one locally if he is on leave.    No orders of the defined types were placed in this encounter.  Patient Instructions   I would consider meeting with other counselor locally or updating your prior counselor with current concerns. Let me know if you need other numbers.  Based on your recent symptoms after workout last week - I want you to rest this week and I will look into appropriate workup for possible underlying myopathy.  Once we determine next step - can decide on repeat testing interval as well as exercise regimen form here forward.   I will check your liver tests, blood count and CPK level again today. Make sure you're drinking plenty of water throughout the day, regular meals throughout the day.       IF you received an x-ray today, you will receive an invoice from [facility]. Please contact [facility] at [phone] with  questions or concerns regarding your invoice.   IF you received labwork today, you will receive an invoice from [employer]. Please contact [facility] at [phone] with questions or concerns regarding your invoice.   Our billing staff will not be able to assist you with questions regarding  bills from these companies.  You will be contacted with the lab results as soon as they are available. The fastest way to get your results is to activate your My Chart account. Instructions are located on the last page of this paperwork. If you have not heard from [place] regarding the results in 2 weeks, please contact this office.        I personally performed the services described in this documentation, which was scribed in my presence. The recorded information has been reviewed and considered, and addended by me as needed.   Signed,   [name], MD [facility].  05/03/16 10:44 PM

## 2016-05-02 ENCOUNTER — Ambulatory Visit: Payer: BC Managed Care – PPO

## 2016-05-08 ENCOUNTER — Encounter: Payer: Self-pay | Admitting: Family Medicine

## 2016-05-18 ENCOUNTER — Encounter: Payer: Self-pay | Admitting: Family Medicine

## 2016-05-18 ENCOUNTER — Ambulatory Visit (INDEPENDENT_AMBULATORY_CARE_PROVIDER_SITE_OTHER): Payer: BC Managed Care – PPO | Admitting: Family Medicine

## 2016-05-18 VITALS — BP 98/70 | HR 60 | Temp 98.8°F | Resp 16 | Ht 64.0 in | Wt 116.8 lb

## 2016-05-18 DIAGNOSIS — R5383 Other fatigue: Secondary | ICD-10-CM

## 2016-05-18 DIAGNOSIS — G479 Sleep disorder, unspecified: Secondary | ICD-10-CM

## 2016-05-18 DIAGNOSIS — M6282 Rhabdomyolysis: Secondary | ICD-10-CM

## 2016-05-18 DIAGNOSIS — F4320 Adjustment disorder, unspecified: Secondary | ICD-10-CM

## 2016-05-18 DIAGNOSIS — R7989 Other specified abnormal findings of blood chemistry: Secondary | ICD-10-CM | POA: Diagnosis not present

## 2016-05-18 DIAGNOSIS — R945 Abnormal results of liver function studies: Secondary | ICD-10-CM

## 2016-05-18 NOTE — Progress Notes (Signed)
By signing my name below, I, Lori Hutchinson, attest that this documentation has been prepared under the direction and in the presence of Lori Ray, MD.  Electronically Signed: Verlee Hutchinson, Medical Scribe. 05/18/16. 4:37 PM.  Subjective:    Patient ID: Lori Hutchinson, female    DOB: 08-09-1975, 40 y.o.   MRN: [id number]  HPI Chief Complaint  Patient presents with  . Follow-up    Rhabdomyolysis    HPI Comments: Lori Hutchinson is a 40 y.o. female who presents to the [facility] for rhabdomyolysis follow-up. This was initially dx 10/14th with initial CPK of 15,702 and myoglobin 1,441; CPK peak 25,958 on 10/17th, and the last elevation of her CPK was 247 on 10/27th. See prev visits. I saw her 11/20th and at that time, still did not feel back to nl. Occasional dizziness and fatigue. Most recent CKP was nl at 72, she had a nl CMP, borderline ALT elevation at 49, CBC reassuring with borderline WBC of 3.7. Of note her last TSH was nl in October.  Feels myalgias when she tries to exercise the minimum of 30 min walks daily, and on the bike. She was recommended to get complete rest on 11/29. She noticed when she exercises 2 days in a row, the 3rd day is "rough". States she did have problems with injuries in the past but she would ignore it. FHx: Mom has arthritis.  Reported Activity Log:  11/30th Thursday: 30 min walk, and stretching at home. She felt fine. 12/1st Friday: 30 mins walk, 10 mins on bike and stretching at home with no problems at night, 12/2nd Saturday: Felt aches and fatigue 12/3 Sunday: She slept 4 hours the night before so she wasn't sure if she had myalgias this day. Yoga instructing for 4 hours (notes it was light with poses, head stands, but no hand stands) and a walk for 30 mins. 12/4 Monday: She didn't feel bad when she woke up, but after walking and carry 30 note books at once for 5 mins, measuring a blocks length. Sore that night in bilateral biceps  that's described as a stabbing pain. 12/5 Tuesday: Weakness and sore in the legs. No exercise. 12/6 Wednesday: Weakness and sore in the legs. No exercise. Slept 10+ hours this night 12/7 Today: She feels okay, and suspects it's due to her getting sleep  Anxiety: Reports feeling depressed since she can't exercise, which is how she manages her stress and maintain a regular sleep schedule. When she doesn't, exercise she'll sleep for 4 hours or 10 hours. 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 States she took anti-depressants in the past, and she didn't do well with them. She will not try any medications for depression. She eats 6 snacks in the day, and states she doesn't really eat meals. Got a referral for a therapist since her regular therapist is on leave. Pt plans on being out of town for a month 12/13th - 1/2nd to see friends and then family. She states she will be fine during this time since her family is supportive. She used to hurt herself when she was a teenager. Denies SI, recent thoughts of self harm.  Patient Active Problem List   Diagnosis Date Noted  . Sinus bradycardia   . 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 Anemia 12/20/2011  . Borderline personality disorder  12/20/2011  . 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 PMS (premenstrual syndrome) 12/20/2011   Past Medical History:  Diagnosis Date  . Abnormal Pap smear 1996   Treatment cryosurgery  . Asthma   . Bacterial infection   . H/O borderline personality disorder 12/23/10  . H/O metrorrhagia 06/09/11  . H/O varicella   . Yeast infection    Past Surgical History:  Procedure Laterality Date  . WISDOM TOOTH EXTRACTION     Allergies  Allergen Reactions  . Erythromycin Nausea And Vomiting  . Percocet [Oxycodone-Acetaminophen] Nausea And Vomiting  . Shellfish Allergy Nausea  And Vomiting    Stomach burning-- pt was unable to eat for days following reaction   . Iodine Rash  . Latex Rash   Prior to Admission medications   Medication Sig Start Date End Date Taking? Authorizing Provider  PROAIR HFA 108 [phone] Base) MCG/ACT inhaler Inhale 2 puffs into the lungs every 4 (four) hours as needed for wheezing or shortness of breath.  02/24/16   Historical Provider, MD   Social History   Social History  . Marital status: Single    Spouse name: N/A  . Number of children: N/A  . Years of education: N/A   Occupational History  . Not on file.   Social History Main Topics  . Smoking status: Former Smoker    Years: 13.00    Quit date: 06/18/2002  . Smokeless tobacco: Never Used  . Alcohol use No  . Drug use: No  . Sexual activity: Yes    Partners: Male     Comment: lo loestrin   Other Topics Concern  . Not on file   Social History Narrative  . No narrative on file   Review of Systems  Constitutional: Positive for fatigue.  Musculoskeletal: Positive for myalgias.  Psychiatric/Behavioral: Positive for dysphoric mood and sleep disturbance. Negative for self-injury and suicidal ideas.   Objective:  Physical Exam  Constitutional: She appears well-developed and well-nourished. No distress.  HENT:  Head: Normocephalic and atraumatic.  Eyes: Conjunctivae are normal.  Neck: Neck supple.  Cardiovascular: Normal rate.   Pulmonary/Chest: Effort normal.  Neurological: She is alert.  Skin: Skin is warm and dry.  Psychiatric: She has a normal mood and affect. Her behavior is normal.  Nursing note and vitals reviewed.  BP 98/70   Pulse 60   Temp 98.8 F (37.1 C) (Oral)   Resp 16   Ht _0  (1.626 m)   Wt 116 lb 12.8 oz (53 kg)   SpO2 99%   BMI 20.05 kg/m  Assessment & Plan:   Lori Hutchinson is a 40 y.o. female Non-traumatic rhabdomyolysis - Plan: CK  - See prior visits. Suspected exertional rhabdomyolysis with relative volume depletion after significant  caffeine intake as well as increased activity/exercise after brief time of deconditioning and illness. No similar known issues with exertional rhabdo earlier in her high school/college, but does admit to episodic myalgias in the past with exercise. Most recent CPK reassuring. Attempting slow return to activity, but based on recent history, appears to have return of myalgias after 2 consecutive days of exercise.   -repeat CPK, then will decide on possible rheumatologic evaluation for underlying muscular disorder.   -Plan for slow resumption of activity as symptoms tolerate, with CPK measurement as needed to monitor if symptomatic.   Fatigue, unspecified type Sleep disorder Adjustment disorder, unspecified type  - Suspected adjustment disorder with anxiety symptoms without being able to return to previous level of  exercise. Previous history of eating disorder, but had been stable and maintaining frequent meals throughout the day, weight is overall stable from past few visits. Denies return of eating disorder symptoms, but I did discuss overexercise as another possible maladaptive pattern. She had switched to sports such as cross fit which had less attention to thin body habitus, and had been meeting with a counselor.   - Has new name for other counselor as her current counselor is on leave. However as she is traveling and can be with family and friends, she feels she does not need new resources at this time.  -Discussed Benadryl at bedtime if needed for insomnia/anxiety symptoms. She declined other medications including hydroxyzine, and does not want to take SSRI or other prescription medication for anxiety/depression as she did not tolerate any of these in the past.  Elevated LFTs - Plan: Hepatic Function Panel  -Repeat LFTs.  No orders of the defined types were placed in this encounter.  Patient Instructions   I will recheck the muscle enzyme tests today, but for now stick to no more than 30  minutes of walking on alternating days for 1 week. If you are symptom-free with that regimen, can increase walking to 45 minutes every other day. If you have any further muscle fatigue or weakness, return to at least 2 days of rest. I will check into specialist to help evaluate the symptoms further. Give me an update in the next few weeks of how you're doing through my chart.  If needed for sleep at night, can take Tylenol PM over-the-counter or Benadryl.   Stress and Stress Management Stress is a normal reaction to life events. It is what you feel when life demands more than you are used to or more than you can handle. Some stress can be useful. For example, the stress reaction can help you catch the last bus of the day, study for a test, or meet a deadline at work. But stress that occurs too often or for too long can cause problems. It can affect your emotional health and interfere with relationships and normal daily activities. Too much stress can weaken your immune system and increase your risk for physical illness. If you already have a medical problem, stress can make it worse. What are the causes? All sorts of life events may cause stress. An event that causes stress for one person may not be stressful for another person. Major life events commonly cause stress. These may be positive or negative. Examples include losing your job, moving into a new home, getting married, having a baby, or losing a loved one. Less obvious life events may also cause stress, especially if they occur day after day or in combination. Examples include working long hours, driving in traffic, caring for children, being in debt, or being in a difficult relationship. What are the signs or symptoms? Stress may cause emotional symptoms including, the following:  Anxiety. This is feeling worried, afraid, on edge, overwhelmed, or out of control.  Anger. This is feeling irritated or impatient.  Depression. This is feeling  sad, down, helpless, or guilty.  Difficulty focusing, remembering, or making decisions. Stress may cause physical symptoms, including the following:  Aches and pains. These may affect your head, neck, back, stomach, or other areas of your body.  Tight muscles or clenched jaw.  Low energy or trouble sleeping. Stress may cause unhealthy behaviors, including the following:  Eating to feel better (overeating) or skipping meals.  Sleeping too  little, too much, or both.  Working too much or putting off tasks (procrastination).  Smoking, drinking alcohol, or using drugs to feel better. How is this diagnosed? Stress is diagnosed through an assessment by your health care provider. Your health care provider will ask questions about your symptoms and any stressful life events.Your health care provider will also ask about your medical history and may order blood tests or other tests. Certain medical conditions and medicine can cause physical symptoms similar to stress. Mental illness can cause emotional symptoms and unhealthy behaviors similar to stress. Your health care provider may refer you to a mental health professional for further evaluation. How is this treated? Stress management is the recommended treatment for stress.The goals of stress management are reducing stressful life events and coping with stress in healthy ways. Techniques for reducing stressful life events include the following:  Stress identification. Self-monitor for stress and identify what causes stress for you. These skills may help you to avoid some stressful events.  Time management. Set your priorities, keep a calendar of events, and learn to say "no." These tools can help you avoid making too many commitments. Techniques for coping with stress include the following:  Rethinking the problem. Try to think realistically about stressful events rather than ignoring them or overreacting. Try to find the positives in a  stressful situation rather than focusing on the negatives.  Relaxation techniques. These relax the body and mind. Examples include yoga, meditation, tai chi, biofeedback, deep breathing, progressive muscle relaxation, listening to music, being out in nature, journaling, and other hobbies. Again, the key is to find one or more that you enjoy and can do regularly.  Healthy lifestyle. Eat a balanced diet, get plenty of sleep, and do not smoke. Avoid using alcohol or drugs to relax.  Strong support network. Spend time with family, friends, or other people you enjoy being around.Express your feelings and talk things over with someone you trust. Counseling or talktherapy with a mental health professional may be helpful if you are having difficulty managing stress on your own. Medicine is typically not recommended for the treatment of stress.Talk to your health care provider if you think you need medicine for symptoms of stress. Follow these instructions at home:  Keep all follow-up visits as directed by your health care provider.  Take all medicines as directed by your health care provider. Contact a health care provider if:  Your symptoms get worse or you start having new symptoms.  You feel overwhelmed by your problems and can no longer manage them on your own. Get help right away if:  You feel like hurting yourself or someone else. This information is not intended to replace advice given to you by your health care provider. Make sure you discuss any questions you have with your health care provider. Document Released: 11/22/2000 Document Revised: 11/04/2015 Document Reviewed: 01/21/2013 Elsevier Interactive Patient Education  2017 [employer].     IF you received an x-Hutchinson today, you will receive an invoice from [facility]. Please contact [facility] at [phone] with questions or concerns regarding your invoice.   IF you received labwork today, you will receive an  invoice from [employer]. Please contact [facility] at [phone] with questions or concerns regarding your invoice.   Our billing staff will not be able to assist you with questions regarding bills from these companies.  You will be contacted with the lab results as soon as they are available. The fastest way to  get your results is to activate your My Chart account. Instructions are located on the last page of this paperwork. If you have not heard from [place] regarding the results in 2 weeks, please contact this office.       I personally performed the services described in this documentation, which was scribed in my presence. The recorded information has been reviewed and considered, and addended by me as needed.   Signed,   Lori Ray, MD [facility].  05/21/16 5:08 PM

## 2016-05-18 NOTE — Patient Instructions (Addendum)
I will recheck the muscle enzyme tests today, but for now stick to no more than 30 minutes of walking on alternating days for 1 week. If you are symptom-free with that regimen, can increase walking to 45 minutes every other day. If you have any further muscle fatigue or weakness, return to at least 2 days of rest. I will check into specialist to help evaluate the symptoms further. Give me an update in the next few weeks of how you're doing through my chart.  If needed for sleep at night, can take Tylenol PM over-the-counter or Benadryl.   Stress and Stress Management Stress is a normal reaction to life events. It is what you feel when life demands more than you are used to or more than you can handle. Some stress can be useful. For example, the stress reaction can help you catch the last bus of the day, study for a test, or meet a deadline at work. But stress that occurs too often or for too long can cause problems. It can affect your emotional health and interfere with relationships and normal daily activities. Too much stress can weaken your immune system and increase your risk for physical illness. If you already have a medical problem, stress can make it worse. What are the causes? All sorts of life events may cause stress. An event that causes stress for one person may not be stressful for another person. Major life events commonly cause stress. These may be positive or negative. Examples include losing your job, moving into a new home, getting married, having a baby, or losing a loved one. Less obvious life events may also cause stress, especially if they occur day after day or in combination. Examples include working long hours, driving in traffic, caring for children, being in debt, or being in a difficult relationship. What are the signs or symptoms? Stress may cause emotional symptoms including, the following:  Anxiety. This is feeling worried, afraid, on edge, overwhelmed, or out of  control.  Anger. This is feeling irritated or impatient.  Depression. This is feeling sad, down, helpless, or guilty.  Difficulty focusing, remembering, or making decisions. Stress may cause physical symptoms, including the following:  Aches and pains. These may affect your head, neck, back, stomach, or other areas of your body.  Tight muscles or clenched jaw.  Low energy or trouble sleeping. Stress may cause unhealthy behaviors, including the following:  Eating to feel better (overeating) or skipping meals.  Sleeping too little, too much, or both.  Working too much or putting off tasks (procrastination).  Smoking, drinking alcohol, or using drugs to feel better. How is this diagnosed? Stress is diagnosed through an assessment by your health care provider. Your health care provider will ask questions about your symptoms and any stressful life events.Your health care provider will also ask about your medical history and may order blood tests or other tests. Certain medical conditions and medicine can cause physical symptoms similar to stress. Mental illness can cause emotional symptoms and unhealthy behaviors similar to stress. Your health care provider may refer you to a mental health professional for further evaluation. How is this treated? Stress management is the recommended treatment for stress.The goals of stress management are reducing stressful life events and coping with stress in healthy ways. Techniques for reducing stressful life events include the following:  Stress identification. Self-monitor for stress and identify what causes stress for you. These skills may help you to avoid some stressful events.  Time  management. Set your priorities, keep a calendar of events, and learn to say "no." These tools can help you avoid making too many commitments. Techniques for coping with stress include the following:  Rethinking the problem. Try to think realistically about  stressful events rather than ignoring them or overreacting. Try to find the positives in a stressful situation rather than focusing on the negatives.  Relaxation techniques. These relax the body and mind. Examples include yoga, meditation, tai chi, biofeedback, deep breathing, progressive muscle relaxation, listening to music, being out in nature, journaling, and other hobbies. Again, the key is to find one or more that you enjoy and can do regularly.  Healthy lifestyle. Eat a balanced diet, get plenty of sleep, and do not smoke. Avoid using alcohol or drugs to relax.  Strong support network. Spend time with family, friends, or other people you enjoy being around.Express your feelings and talk things over with someone you trust. Counseling or talktherapy with a mental health professional may be helpful if you are having difficulty managing stress on your own. Medicine is typically not recommended for the treatment of stress.Talk to your health care provider if you think you need medicine for symptoms of stress. Follow these instructions at home:  Keep all follow-up visits as directed by your health care provider.  Take all medicines as directed by your health care provider. Contact a health care provider if:  Your symptoms get worse or you start having new symptoms.  You feel overwhelmed by your problems and can no longer manage them on your own. Get help right away if:  You feel like hurting yourself or someone else. This information is not intended to replace advice given to you by your health care provider. Make sure you discuss any questions you have with your health care provider. Document Released: 11/22/2000 Document Revised: 11/04/2015 Document Reviewed: 01/21/2013 Elsevier Interactive Patient Education  2017 [employer]     IF you received an x-ray today, you will receive an invoice from [facility]. Please contact [facility] at [phone] with questions  or concerns regarding your invoice.   IF you received labwork today, you will receive an invoice from [facility]. Please contact [facility] at [phone] with questions or concerns regarding your invoice.   Our billing staff will not be able to assist you with questions regarding bills from these companies.  You will be contacted with the lab results as soon as they are available. The fastest way to get your results is to activate your My Chart account. Instructions are located on the last page of this paperwork. If you have not heard from us regarding the results in 2 weeks, please contact this office.     

## 2016-05-19 LAB — SPECIMEN STATUS

## 2016-05-19 LAB — HEPATIC FUNCTION PANEL

## 2016-05-19 LAB — CK: CK TOTAL: 119 U/L (ref 24–173)

## 2016-05-20 LAB — HEPATIC FUNCTION PANEL
ALBUMIN: 4.4 g/dL (ref 3.5–5.5)
ALK PHOS: 65 IU/L (ref 39–117)
ALT: 51 IU/L — AB (ref 0–32)
AST: 34 IU/L (ref 0–40)
BILIRUBIN TOTAL: 0.3 mg/dL (ref 0.0–1.2)
Bilirubin, Direct: 0.08 mg/dL (ref 0.00–0.40)
Total Protein: 6.6 g/dL (ref 6.0–8.5)

## 2016-05-20 LAB — SPECIMEN STATUS REPORT

## 2016-06-16 ENCOUNTER — Encounter: Payer: Self-pay | Admitting: Family Medicine

## 2016-06-16 DIAGNOSIS — R42 Dizziness and giddiness: Secondary | ICD-10-CM

## 2016-06-16 DIAGNOSIS — R002 Palpitations: Secondary | ICD-10-CM

## 2016-06-16 DIAGNOSIS — R5383 Other fatigue: Secondary | ICD-10-CM

## 2016-06-21 NOTE — Telephone Encounter (Signed)
Will initially refer to cardiology, depending on their workup consider neurology evaluation as well for fatigue and dizziness.

## 2016-07-11 ENCOUNTER — Encounter: Payer: Self-pay | Admitting: Cardiovascular Disease

## 2016-07-11 ENCOUNTER — Ambulatory Visit (INDEPENDENT_AMBULATORY_CARE_PROVIDER_SITE_OTHER): Payer: BC Managed Care – PPO | Admitting: Cardiovascular Disease

## 2016-07-11 VITALS — BP 114/70 | HR 65 | Ht 64.0 in | Wt 120.0 lb

## 2016-07-11 DIAGNOSIS — M6282 Rhabdomyolysis: Secondary | ICD-10-CM

## 2016-07-11 NOTE — Progress Notes (Signed)
Cardiology Office Note   Date:  07/11/2016   ID:  Jorja Empie, DOB 04/27/1976, MRN [id number]  PCP:  [name], MD  Cardiologist:   Mertie Moores, MD   Chief Complaint  Patient presents with  . New Patient (Initial Visit)    palpitations/dizziness/fatigue      History of Present Illness: Raelea Gosse is a 41 y.o. female who presents for eval of weakness and dizziness following a hospitalization for Rhabdo.   She was doing a crossfit routine, was dehydrated , had lots of coffee, energy drinks , caffeine.  Did lots of pull ups and then developed progressive rhabdomyolysis ( hospitalized Oct. 14, 2017 - Oct. 19, 2017)   Was taking branch chain amino acids during that time.   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   Performed at [employer]  2 years .   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 Has weakness,   Feels very drained with any exertion  Typically has lots of fatigue around Thursday of each week. Works 14 hour days and is then struggles to get rested    Past Medical History:  Diagnosis Date  . Abnormal Pap smear 1996   Treatment cryosurgery  . Asthma   . Bacterial infection   . H/O borderline personality disorder 12/23/10  . H/O metrorrhagia 06/09/11  . H/O varicella   . Yeast infection     Past Surgical History:  Procedure Laterality Date  . WISDOM TOOTH EXTRACTION       Current Outpatient Prescriptions  Medication Sig Dispense Refill  . PROAIR HFA 108 (90 Base) MCG/ACT inhaler Inhale 2 puffs into the lungs every 4 (four) hours as needed for wheezing or shortness of breath.      No current facility-administered medications for this visit.     Allergies:   Erythromycin; Percocet [oxycodone-acetaminophen]; Shellfish allergy; Iodine; and Latex    Social History:  The patient  reports that she quit smoking about 14 years ago. She quit after 13.00 years of use. She has never  used smokeless tobacco. She reports that she does not drink alcohol or use drugs.   Family History:  The patient's family history includes Asthma in her mother; Cancer in her father, maternal grandmother, paternal grandfather, and paternal grandmother; Heart disease in her father.    ROS:  Please see the history of present illness.    Review of Systems: Constitutional:  denies fever, chills, diaphoresis, appetite change and fatigue.  HEENT: denies photophobia, eye pain, redness, hearing loss, ear pain, congestion, sore throat, rhinorrhea, sneezing, neck pain, neck stiffness and tinnitus.  Respiratory: denies SOB, DOE, cough, chest tightness, and wheezing.  Cardiovascular: denies chest pain, palpitations and leg swelling.  Gastrointestinal: denies nausea, vomiting, abdominal pain, diarrhea, constipation, blood in stool.  Genitourinary: denies dysuria, urgency, frequency, hematuria, flank pain and difficulty urinating.  Musculoskeletal: denies  myalgias, back pain, joint swelling, arthralgias and gait problem.   Skin: denies pallor, rash and wound.  Neurological: denies dizziness, seizures, syncope, weakness, light-headedness, numbness and headaches.   Hematological: denies adenopathy, easy bruising, personal or family bleeding history.  Psychiatric/ Behavioral: denies suicidal ideation, mood changes, confusion, nervousness, sleep disturbance and agitation.       All other systems are reviewed and negative.    PHYSICAL EXAM: VS:  BP 114/70   Pulse 65   Ht 5' 4"  (1.626 m)   Wt  120 lb (54.4 kg)   BMI 20.60 kg/m  , BMI Body mass index is 20.6 kg/m. GEN: Well nourished, well developed, in no acute distress  HEENT: normal  Neck: no JVD, carotid bruits, or masses Cardiac: RRR; no murmurs, rubs, or gallops,no edema  Respiratory:  clear to auscultation bilaterally, normal work of breathing GI: soft, nontender, nondistended, + BS MS: no deformity or atrophy  Skin: warm and dry, no  rash Neuro:  Strength and sensation are intact Psych: normal   EKG:  EKG is ordered today. The ekg ordered today demonstrates  NSR at 65.   RAD     Recent Labs: 03/27/2016: TSH 2.429 05/01/2016: BUN 16; Creat 0.87; Hemoglobin 14.3; Potassium 4.2; Sodium 141 05/18/2016: ALT CANCELED; Platelets WILL FOLLOW    Lipid Panel No results found for: CHOL, TRIG, HDL, CHOLHDL, VLDL, LDLCALC, LDLDIRECT    Wt Readings from Last 3 Encounters:  07/11/16 120 lb (54.4 kg)  05/18/16 116 lb 12.8 oz (53 kg)  05/01/16 114 lb 12.8 oz (52.1 kg)      Other studies Reviewed: Additional studies/ records that were reviewed today include: . Review of the above records demonstrates:    ASSESSMENT AND PLAN:  1.  Rhabdomyolysis: Audelia Acton was admitted with rhabdomyolysis in October. She had not been eating well and was dehydrated. She was doing very stressful workouts at [employer] . She developed rhabdomyolysis and was hospitalized for 5 days. Her CPK levels gradually have corrected.  Since that time she has not regained her strength. She's not longer having severe muscle aches but gets fatigued and gets lightheaded with minimal exertion. She is able to walk at a normal pace without having profound dizziness. Her diet has been pretty good recently.  She works 14 hours a day. 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  At this point I do not think that she has significant cardiac disease. It would be interesting to do an echocardiogram but at this point her cardiac exam is completely normal and I think that they're there is a low likelihood that she has any cardiac abnormalities.  .  We will  wait and do an echocardiogram at a later time if she does not improve.   She is in agreement with this    I'll see her in 3 months.  Current medicines are reviewed at length with the patient today.  The patient does not have concerns regarding medicines.  Labs/ tests  ordered today include:  No orders of the defined types were placed in this encounter.    Disposition:   FU with me in 3 months      Mertie Moores, MD  07/11/2016 10:23 AM    [address], [place], [place]  [address] Phone: [phone]; Fax: [phone]

## 2016-07-11 NOTE — Patient Instructions (Signed)
Medication Instructions:  Your physician recommends that you continue on your current medications as directed. Please refer to the Current Medication list given to you today.   Labwork: None Ordered   Testing/Procedures: None Ordered   Follow-Up: Your physician recommends that you schedule a follow-up appointment in: 3 months with Dr. Acie Fredrickson   If you need a refill on your cardiac medications before your next appointment, please call your pharmacy.   Thank you for choosing [facility]! Christen Bame, RN [phone]

## 2016-09-19 ENCOUNTER — Encounter: Payer: Self-pay | Admitting: Cardiovascular Disease

## 2016-10-06 ENCOUNTER — Ambulatory Visit: Payer: BC Managed Care – PPO | Admitting: Cardiovascular Disease

## 2016-10-10 ENCOUNTER — Ambulatory Visit: Payer: BC Managed Care – PPO

## 2016-10-13 ENCOUNTER — Ambulatory Visit: Payer: BC Managed Care – PPO

## 2016-10-16 ENCOUNTER — Encounter: Payer: Self-pay | Admitting: Family Medicine

## 2016-10-16 ENCOUNTER — Ambulatory Visit (INDEPENDENT_AMBULATORY_CARE_PROVIDER_SITE_OTHER): Payer: BC Managed Care – PPO | Admitting: Family Medicine

## 2016-10-16 VITALS — BP 104/67 | HR 77 | Temp 98.5°F | Resp 16 | Ht 63.75 in | Wt 121.8 lb

## 2016-10-16 DIAGNOSIS — J029 Acute pharyngitis, unspecified: Secondary | ICD-10-CM

## 2016-10-16 DIAGNOSIS — J069 Acute upper respiratory infection, unspecified: Secondary | ICD-10-CM | POA: Diagnosis not present

## 2016-10-16 DIAGNOSIS — J45909 Unspecified asthma, uncomplicated: Secondary | ICD-10-CM | POA: Diagnosis not present

## 2016-10-16 LAB — POCT RAPID STREP A (OFFICE): Rapid Strep A Screen: NEGATIVE

## 2016-10-16 NOTE — Progress Notes (Signed)
By signing my name below, I, Lori Hutchinson, attest that this documentation has been prepared under the direction and in the presence of Lori Ray, MD.  Electronically Signed: Verlee Hutchinson, Medical Scribe. 10/16/16. 2:43 PM.  Subjective:    Patient ID: Lori Hutchinson, female    DOB: 09-17-1975, 41 y.o.   MRN: [id number]  HPI Chief Complaint  Patient presents with  . Cough    symptoms started x 4 days ago  . Sore Throat  . fever    100 degrees on Sat., no fever since then    HPI Comments: Lori Hutchinson is a 41 y.o. female who presents to Primary Care at [facility] complaining of sore throat 4 days ago. Reports associated sxs of low-grade fever of 100, myalgias, wheezing, dry cough, congestion, swollen throat causing trouble swallowing, and sleep disturbance. Pt has seasonal allergies and she has an inhaler for her allergies. She doesn't like using her inhaler because it makes her dizzy and now that's she's not exercising as much she doesn't need it. Pt is not currently taking anything for her allergies but she usually doesn't take anything until she has to. Denies sick contacts but mentions she's been stressed out lately.  Rhabdomyolysis: Pt states she's still not fully recovered. Pt is in PT. Pt was eating over 100 g of protein and under 1000 calories at the time of onset of her dx. Slowly improving.   Patient Active Problem List   Diagnosis Date Noted  . Sinus bradycardia   . 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 Anemia 12/20/2011  . Borderline personality disorder 12/20/2011  . 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 PMS (premenstrual syndrome) 12/20/2011   Past Medical History:  Diagnosis Date  . Abnormal Pap smear 1996   Treatment cryosurgery  . Asthma   . Bacterial infection   . H/O borderline personality disorder 12/23/10  . H/O metrorrhagia 06/09/11  . H/O varicella   . Yeast infection    Past Surgical History:  Procedure Laterality Date  . WISDOM TOOTH  EXTRACTION     Allergies  Allergen Reactions  . Erythromycin Nausea And Vomiting  . Percocet [Oxycodone-Acetaminophen] Nausea And Vomiting  . Shellfish Allergy Nausea And Vomiting    Stomach burning-- pt was unable to eat for days following reaction   . Iodine Rash  . Latex Rash   Prior to Admission medications   Medication Sig Start Date End Date Taking? Authorizing Provider  PROAIR HFA 108 [phone] Base) MCG/ACT inhaler Inhale 2 puffs into the lungs every 4 (four) hours as needed for wheezing or shortness of breath.  02/24/16   [provider]   Social History   Social History  . Marital status: Single    Spouse name: N/A  . Number of children: N/A  . Years of education: N/A   Occupational History  . Not on file.   Social History Main Topics  . Smoking status: Former Smoker    Years: 13.00    Quit date: 06/18/2002  . Smokeless tobacco: Never Used  . Alcohol use No  . Drug use: No  . Sexual activity: Yes    Partners: Male     Comment: lo loestrin   Other Topics Concern  . Not on file   Social History Narrative  . No narrative on file   Review of Systems  Constitutional: Positive for fever.  HENT: Positive for congestion, sore throat and trouble swallowing.   Respiratory: Positive for cough and wheezing.   Musculoskeletal: Positive  for myalgias.  Allergic/Immunologic: Positive for environmental allergies.  Psychiatric/Behavioral: Positive for sleep disturbance.   Objective:  Physical Exam  Constitutional: She appears well-developed and well-nourished. No distress.  HENT:  Head: Normocephalic and atraumatic.  Mouth/Throat: Posterior oropharyngeal erythema present. No oropharyngeal exudate.  No PTA Uvula midline  Eyes: Conjunctivae are normal.  Neck: Neck supple.  Cardiovascular: Normal rate, regular rhythm and normal heart sounds.  Exam reveals no gallop and no friction rub.   No murmur heard. Pulmonary/Chest: Effort normal and breath sounds normal. No  respiratory distress. She has no wheezes. She has no rales.  Lymphadenopathy:    She has no cervical adenopathy.  No lymph adenopathy  Neurological: She is alert.  Skin: Skin is warm and dry.  Psychiatric: She has a normal mood and affect. Her behavior is normal.  Nursing note and vitals reviewed.   Vitals:   10/16/16 1403  BP: 104/67  Pulse: 77  Resp: 16  Temp: 98.5 F (36.9 C)  TempSrc: Oral  SpO2: 97%  Weight: 121 lb 12.8 oz (55.2 kg)  Height: 5' 3.75" (1.619 m)  Body mass index is 21.07 kg/m.   Results for orders placed or performed in visit on 10/16/16  POCT rapid strep A  Result Value Ref Range   Rapid Strep A Screen Negative Negative    Assessment & Plan:  [name] is a 41 y.o. female Sore throat - Plan: POCT rapid strep A, Culture, Group A Strep  Acute upper respiratory infection  Reactive airway disease without complication, unspecified asthma severity, unspecified whether persistent Suspected viral illness. Will check throat culture, but unlikely strep. Lungs clear, no wheezes present. Intolerant with albuterol side effects, but again no wheeze at present. -symptomatic care discussed  -Consider Singulair or inhaled corticosteroid if needed for asthmatic symptoms. Other allergy treatment discussed.   No orders of the defined types were placed in this encounter.  Patient Instructions     Saline nasal spray if needed for congestion, cepacol cough drops, over the counter mucinex or mucinex DM, drink plenty of fluids.   Allegra, Claritin or Zyrtec for allergies, and can also use flonase nasal spray if needed.  If you are having wheezing, or more asthma symptoms, can either use albuterol, or can look at other options like Singulair or inhaled corticosteroid. Let me know.    IF you received an x-Hutchinson today, you will receive an invoice from [facility]. Please contact [facility] at [phone] with questions or concerns regarding your  invoice.   IF you received labwork today, you will receive an invoice from [facility]. Please contact [facility] at [phone] with questions or concerns regarding your invoice.   Our billing staff will not be able to assist you with questions regarding bills from these companies.  You will be contacted with the lab results as soon as they are available. The fastest way to get your results is to activate your My Chart account. Instructions are located on the last page of this paperwork. If you have not heard from [place] regarding the results in 2 weeks, please contact this office.      I personally performed the services described in this documentation, which was scribed in my presence. The recorded information has been reviewed and considered for accuracy and completeness, addended by me as needed, and agree with information above.  Signed,   Lori Ray, MD [facility] at [facility].  10/16/16 4:34 PM

## 2016-10-16 NOTE — Progress Notes (Signed)
Neg

## 2016-10-16 NOTE — Patient Instructions (Addendum)
   Saline nasal spray if needed for congestion, cepacol cough drops, over the counter mucinex or mucinex DM, drink plenty of fluids.   Allegra, Claritin or Zyrtec for allergies, and can also use flonase nasal spray if needed.  If you are having wheezing, or more asthma symptoms, can either use albuterol, or can look at other options like Singulair or inhaled corticosteroid. Let me know.    IF you received an x-ray today, you will receive an invoice from [facility]. Please contact [facility] at [phone] with questions or concerns regarding your invoice.   IF you received labwork today, you will receive an invoice from [facility]. Please contact [facility] at [phone] with questions or concerns regarding your invoice.   Our billing staff will not be able to assist you with questions regarding bills from these companies.  You will be contacted with the lab results as soon as they are available. The fastest way to get your results is to activate your My Chart account. Instructions are located on the last page of this paperwork. If you have not heard from [place] regarding the results in 2 weeks, please contact this office.

## 2016-10-18 LAB — CULTURE, GROUP A STREP: Strep A Culture: NEGATIVE

## 2016-10-20 ENCOUNTER — Encounter: Payer: Self-pay | Admitting: Urgent Care

## 2016-10-20 ENCOUNTER — Ambulatory Visit (INDEPENDENT_AMBULATORY_CARE_PROVIDER_SITE_OTHER): Payer: BC Managed Care – PPO | Admitting: Urgent Care

## 2016-10-20 VITALS — BP 111/72 | HR 109 | Temp 99.5°F | Resp 16 | Ht 64.0 in | Wt 119.0 lb

## 2016-10-20 DIAGNOSIS — H578 Other specified disorders of eye and adnexa: Secondary | ICD-10-CM

## 2016-10-20 DIAGNOSIS — J029 Acute pharyngitis, unspecified: Secondary | ICD-10-CM

## 2016-10-20 DIAGNOSIS — R059 Cough, unspecified: Secondary | ICD-10-CM

## 2016-10-20 DIAGNOSIS — R51 Headache: Secondary | ICD-10-CM | POA: Diagnosis not present

## 2016-10-20 DIAGNOSIS — J018 Other acute sinusitis: Secondary | ICD-10-CM

## 2016-10-20 DIAGNOSIS — H5789 Other specified disorders of eye and adnexa: Secondary | ICD-10-CM

## 2016-10-20 DIAGNOSIS — R05 Cough: Secondary | ICD-10-CM | POA: Diagnosis not present

## 2016-10-20 DIAGNOSIS — R519 Headache, unspecified: Secondary | ICD-10-CM

## 2016-10-20 MED ORDER — AMOXICILLIN-POT CLAVULANATE 250-62.5 MG/5ML PO SUSR: 500.0000 mg | Freq: Three times a day (TID) | ORAL | 0 refills | Status: AC

## 2016-10-20 MED ORDER — BENZONATATE 100 MG PO CAPS: 100.0000 mg | ORAL_CAPSULE | Freq: Three times a day (TID) | ORAL | 0 refills | Status: DC | PRN

## 2016-10-20 MED ORDER — BACITRACIN-POLYMYXIN B 500-10000 UNIT/GM OP OINT: 1.0000 "application " | TOPICAL_OINTMENT | Freq: Four times a day (QID) | OPHTHALMIC | 0 refills | Status: DC

## 2016-10-20 NOTE — Patient Instructions (Addendum)
Sinusitis, Adult Sinusitis is soreness and inflammation of your sinuses. Sinuses are hollow spaces in the bones around your face. Your sinuses are located:  Around your eyes.  In the middle of your forehead.  Behind your nose.  In your cheekbones. Your sinuses and nasal passages are lined with a stringy fluid (mucus). Mucus normally drains out of your sinuses. When your nasal tissues become inflamed or swollen, the mucus can become trapped or blocked so air cannot flow through your sinuses. This allows bacteria, viruses, and funguses to grow, which leads to infection. Sinusitis can develop quickly and last for 7?10 days (acute) or for more than 12 weeks (chronic). Sinusitis often develops after a cold. What are the causes? This condition is caused by anything that creates swelling in the sinuses or stops mucus from draining, including:  Allergies.  Asthma.  Bacterial or viral infection.  Abnormally shaped bones between the nasal passages.  Nasal growths that contain mucus (nasal polyps).  Narrow sinus openings.  Pollutants, such as chemicals or irritants in the air.  A foreign object stuck in the nose.  A fungal infection. This is rare. What increases the risk? The following factors may make you more likely to develop this condition:  Having allergies or asthma.  Having had a recent cold or respiratory tract infection.  Having structural deformities or blockages in your nose or sinuses.  Having a weak immune system.  Doing a lot of swimming or diving.  Overusing nasal sprays.  Smoking. What are the signs or symptoms? The main symptoms of this condition are pain and a feeling of pressure around the affected sinuses. Other symptoms include:  Upper toothache.  Earache.  Headache.  Bad breath.  Decreased sense of smell and taste.  A cough that may get worse at night.  Fatigue.  Fever.  Thick drainage from your nose. The drainage is often green and it may  contain pus (purulent).  Stuffy nose or congestion.  Postnasal drip. This is when extra mucus collects in the throat or back of the nose.  Swelling and warmth over the affected sinuses.  Sore throat.  Sensitivity to light. How is this diagnosed? This condition is diagnosed based on symptoms, a medical history, and a physical exam. To find out if your condition is acute or chronic, your health care provider may:  Look in your nose for signs of nasal polyps.  Tap over the affected sinus to check for signs of infection.  View the inside of your sinuses using an imaging device that has a light attached (endoscope). If your health care provider suspects that you have chronic sinusitis, you may also:  Be tested for allergies.  Have a sample of mucus taken from your nose (nasal culture) and checked for bacteria.  Have a mucus sample examined to see if your sinusitis is related to an allergy. If your sinusitis does not respond to treatment and it lasts longer than 8 weeks, you may have an MRI or CT scan to check your sinuses. These scans also help to determine how severe your infection is. In rare cases, a bone biopsy may be done to rule out more serious types of fungal sinus disease. How is this treated? Treatment for sinusitis depends on the cause and whether your condition is chronic or acute. If a virus is causing your sinusitis, your symptoms will go away on their own within 10 days. You may be given medicines to relieve your symptoms, including:  Topical nasal decongestants. They  shrink swollen nasal passages and let mucus drain from your sinuses.  Antihistamines. These drugs block inflammation that is triggered by allergies. This can help to ease swelling in your nose and sinuses.  Topical nasal corticosteroids. These are nasal sprays that ease inflammation and swelling in your nose and sinuses.  Nasal saline washes. These rinses can help to get rid of thick mucus in your  nose. If your condition is caused by bacteria, you will be given an antibiotic medicine. If your condition is caused by a fungus, you will be given an antifungal medicine. Surgery may be needed to correct underlying conditions, such as narrow nasal passages. Surgery may also be needed to remove polyps. Follow these instructions at home: Medicines   Take, use, or apply over-the-counter and prescription medicines only as told by your health care provider. These may include nasal sprays.  If you were prescribed an antibiotic medicine, take it as told by your health care provider. Do not stop taking the antibiotic even if you start to feel better. Hydrate and Humidify   Drink enough water to keep your urine clear or pale yellow. Staying hydrated will help to thin your mucus.  Use a cool mist humidifier to keep the humidity level in your home above 50%.  Inhale steam for 10-15 minutes, 3-4 times a day or as told by your health care provider. You can do this in the bathroom while a hot shower is running.  Limit your exposure to cool or dry air. Rest   Rest as much as possible.  Sleep with your head raised (elevated).  Make sure to get enough sleep each night. General instructions   Apply a warm, moist washcloth to your face 3-4 times a day or as told by your health care provider. This will help with discomfort.  Wash your hands often with soap and water to reduce your exposure to viruses and other germs. If soap and water are not available, use hand sanitizer.  Do not smoke. Avoid being around people who are smoking (secondhand smoke).  Keep all follow-up visits as told by your health care provider. This is important. Contact a health care provider if:  You have a fever.  Your symptoms get worse.  Your symptoms do not improve within 10 days. Get help right away if:  You have a severe headache.  You have persistent vomiting.  You have pain or swelling around your face or  eyes.  You have vision problems.  You develop confusion.  Your neck is stiff.  You have trouble breathing. This information is not intended to replace advice given to you by your health care provider. Make sure you discuss any questions you have with your health care provider. Document Released: 05/29/2005 Document Revised: 01/23/2016 Document Reviewed: 03/24/2015 Elsevier Interactive Patient Education  2017 [employer].     Bacterial Conjunctivitis Bacterial conjunctivitis is an infection of the clear membrane that covers the white part of your eye and the inner surface of your eyelid (conjunctiva). When the blood vessels in your conjunctiva become inflamed, your eye becomes red or pink, and it will probably feel itchy. Bacterial conjunctivitis spreads very easily from person to person (is contagious). It also spreads easily from one eye to the other eye. What are the causes? This condition is caused by several common bacteria. You may get the infection if you come into close contact with another person who is infected. You may also come into contact with items that are contaminated with  the bacteria, such as a face towel, contact lens solution, or eye makeup. What increases the risk? This condition is more likely to develop in people who:  Are exposed to other people who have the infection.  Wear contact lenses.  Have a sinus infection.  Have had a recent eye injury or surgery.  Have a weak body defense system (immune system).  Have a medical condition that causes dry eyes. What are the signs or symptoms? Symptoms of this condition include:  Eye redness.  Tearing or watery eyes.  Itchy eyes.  Burning feeling in your eyes.  Thick, yellowish discharge from an eye. This may turn into a crust on the eyelid overnight and cause your eyelids to stick together.  Swollen eyelids.  Blurred vision. How is this diagnosed? Your health care provider can diagnose this  condition based on your symptoms and medical history. Your health care provider may also take a sample of discharge from your eye to find the cause of your infection. This is rarely done. How is this treated? Treatment for this condition includes:  Antibiotic eye drops or ointment to clear the infection more quickly and prevent the spread of infection to others.  Oral antibiotic medicines to treat infections that do not respond to drops or ointments, or last longer than 10 days.  Cool, wet cloths (cool compresses) placed on the eyes.  Artificial tears applied 2-6 times a day. Follow these instructions at home: Medicines   Take or apply your antibiotic medicine as told by your health care provider. Do not stop taking or applying the antibiotic even if you start to feel better.  Take or apply over-the-counter and prescription medicines only as told by your health care provider.  Be very careful to avoid touching the edge of your eyelid with the eye drop bottle or the ointment tube when you apply medicines to the affected eye. This will keep you from spreading the infection to your other eye or to other people. Managing discomfort   Gently wipe away any drainage from your eye with a warm, wet washcloth or a cotton ball.  Apply a cool, clean washcloth to your eye for 10-20 minutes, 3-4 times a day. General instructions   Do not wear contact lenses until the inflammation is gone and your health care provider says it is safe to wear them again. Ask your health care provider how to sterilize or replace your contact lenses before you use them again. Wear glasses until you can resume wearing contacts.  Avoid wearing eye makeup until the inflammation is gone. Throw away any old eye cosmetics that may be contaminated.  Change or wash your pillowcase every day.  Do not share towels or washcloths. This may spread the infection.  Wash your hands often with soap and water. Use paper towels to dry  your hands.  Avoid touching or rubbing your eyes.  Do not drive or use heavy machinery if your vision is blurred. Contact a health care provider if:  You have a fever.  Your symptoms do not get better after 10 days. Get help right away if:  You have a fever and your symptoms suddenly get worse.  You have severe pain when you move your eye.  You have facial pain, redness, or swelling.  You have sudden loss of vision. This information is not intended to replace advice given to you by your health care provider. Make sure you discuss any questions you have with your health care provider. Document  Released: 05/29/2005 Document Revised: 10/07/2015 Document Reviewed: 03/11/2015 Elsevier Interactive Patient Education  2017 [employer].     IF you received an x-ray today, you will receive an invoice from [facility]. Please contact [facility] at [phone] with questions or concerns regarding your invoice.   IF you received labwork today, you will receive an invoice from [place]. Please contact [facility] at [phone] with questions or concerns regarding your invoice.   Our billing staff will not be able to assist you with questions regarding bills from these companies.  You will be contacted with the lab results as soon as they are available. The fastest way to get your results is to activate your My Chart account. Instructions are located on the last page of this paperwork. If you have not heard from [place] regarding the results in 2 weeks, please contact this office.

## 2016-10-20 NOTE — Progress Notes (Signed)
  MRN: [id number] DOB: June 03, 1976  Subjective:   Lori Hutchinson is a 41 y.o. female presenting for chief complaint of right eye ("gummie", started last night 10/19/16)  Reports matted right eye this morning, started having right eye itching, redness, slightly blurred vision since last night. Patient has felt ill for ~1 week now, was last seen here on 10/16/2016 and started management for viral URI. Has continued to have mild sinus congestion, malaise, left ear pain, moderate-severe headaches, dry cough, n/v on Wednesday. Cough elicits chest pain, shob. Has tried Vitamins, Cepacol, Delsym with minimal relief. Patient cannot tolerate Sudafed, cough syrup with hydrocodone or codeine, does not like to take antibiotics.   Lori Hutchinson has a current medication list which includes the following prescription(s): proair hfa. Also is allergic to erythromycin; percocet [oxycodone-acetaminophen]; shellfish allergy; iodine; and latex. 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 Also  has a past surgical history that includes Wisdom tooth extraction.  Objective:   Vitals: BP 111/72   Pulse (!) 109   Temp 99.5 F (37.5 C) (Oral)   Resp 16   Ht 5' 4"  (1.626 m)   Wt 119 lb (54 kg)   SpO2 97%   BMI 20.43 kg/m   Physical Exam  Constitutional: She is oriented to person, place, and time. She appears well-developed and well-nourished.  HENT:  TM's intact bilaterally, no effusions or erythema. Nasal turbinates erythematous, nasal passages patent. No sinus tenderness. Throat with significant post-nasal drainage and slight erythema, mucous membranes moist.  Eyes: EOM are normal. Pupils are equal, round, and reactive to light. Left eye exhibits no discharge. No scleral icterus.  Right conjunctiva injected. No tenderness.  Neck: Normal range of motion. Neck  supple.  Cardiovascular: Normal rate, regular rhythm and intact distal pulses.  Exam reveals no gallop and no friction rub.   No murmur heard. Pulmonary/Chest: No respiratory distress. She has no wheezes. She has no rales.  Lymphadenopathy:    She has cervical adenopathy (bilateral).  Neurological: She is alert and oriented to person, place, and time.  Skin: Skin is warm and dry. Capillary refill takes less than 2 seconds.  Psychiatric: She has a normal mood and affect.   Assessment and Plan :   1. Redness of right eye 2. Other acute sinusitis, recurrence not specified 3. Cough 4. Sinus headache 5. Sore throat - Start Augmentin to address sinusitis, start Polytrim ophthalmic to cover for bacterial conjunctivitis. Tessalon for cough suppression. Recheck in 1 week if no improvement.  Jaynee Eagles, PA-C [facility] [phone] 10/20/2016  10:33 AM

## 2016-10-23 ENCOUNTER — Encounter: Payer: Self-pay | Admitting: Urgent Care

## 2016-10-25 ENCOUNTER — Ambulatory Visit (INDEPENDENT_AMBULATORY_CARE_PROVIDER_SITE_OTHER): Payer: BC Managed Care – PPO | Admitting: Urgent Care

## 2016-10-25 ENCOUNTER — Encounter: Payer: Self-pay | Admitting: Urgent Care

## 2016-10-25 VITALS — BP 108/72 | HR 73 | Temp 97.9°F | Resp 18 | Ht 64.0 in | Wt 120.6 lb

## 2016-10-25 DIAGNOSIS — J018 Other acute sinusitis: Secondary | ICD-10-CM

## 2016-10-25 DIAGNOSIS — H5789 Other specified disorders of eye and adnexa: Secondary | ICD-10-CM

## 2016-10-25 DIAGNOSIS — H578 Other specified disorders of eye and adnexa: Secondary | ICD-10-CM | POA: Diagnosis not present

## 2016-10-25 MED ORDER — NAPHAZOLINE-PHENIRAMINE 0.027-0.315 % OP SOLN: 1.0000 [drp] | Freq: Two times a day (BID) | OPHTHALMIC | 0 refills | Status: DC | PRN

## 2016-10-25 NOTE — Patient Instructions (Addendum)
Finish the antibiotic to make sure that we address sinusitis. You may start to use Claritin with or without Zyrtec. You can also use Opcon A as an antihistamine for eye itching.     Allergic Conjunctivitis, Adult Allergic conjunctivitis is inflammation of the clear membrane that covers the white part of your eye and the inner surface of your eyelid (conjunctiva). The inflammation is caused by allergies. The blood vessels in the conjunctiva become inflamed and this causes the eyes to become red or pink. The eyes often feel itchy. Allergic conjunctivitis cannot be spread from one person to another person (is not contagious). What are the causes? This condition is caused by an allergic reaction. Common causes of an allergic reaction (allergens) include:  Outdoor allergens, such as:  Pollen.  Grass and weeds.  Mold spores.  Indoor allergens, such as:  Dust.  Smoke.  Mold.  Pet dander.  Animal hair. What increases the risk? You may be more likely to develop this condition if you have a family history of allergies, such as:  Allergic rhinitis.  Bronchial asthma.  Atopic dermatitis. What are the signs or symptoms? Symptoms of this condition include eyes that are:  Itchy.  Red.  Watery.  Puffy. Your eyes may also:  Sting or burn.  Have clear drainage coming from them. How is this diagnosed? This condition may be diagnosed by medical history and physical exam. If you have drainage from your eyes, it may be tested to rule out other causes of conjunctivitis. You may also need to see a health care provider who specializes in treating allergies (allergist) or eye conditions (ophthalmologist) for tests to confirm the diagnosis. You may have:  Skin tests to see which allergens are causing your symptoms. These tests involve pricking the skin with a tiny needle and exposing the skin to small amounts of potential allergens to see if your skin reacts.  Blood tests.  Tissue  scrapings from your eyelid. These will be examined under a microscope. How is this treated? Treatments for this condition may include:  Cold cloths (compresses) to soothe itching and swelling.  Washing the face to remove allergens.  Eye drops. These may be prescription or over-the-counter. There are several different types. You may need to try different types to see which one works best for you. Your may need:  Eye drops that block the allergic reaction (antihistamine).  Eye drops that reduce swelling and irritation (anti-inflammatory).  Steroid eye drops to lessen a severe reaction (vernal conjunctivitis).  Oral antihistamine medicines to reduce your allergic reaction. You may need these if eye drops do not help or are difficult to use. Follow these instructions at home:  Avoid known allergens whenever possible.  Take or apply over-the-counter and prescription medicines only as told by your health care provider. These include any eye drops.  Apply a cool, clean washcloth to your eye for 10-20 minutes, 3-4 times a day.  Do not touch or rub your eyes.  Do not wear contact lenses until the inflammation is gone. Wear glasses instead.  Do not wear eye makeup until the inflammation is gone.  Keep all follow-up visits as told by your health care provider. This is important. Contact a health care provider if:  Your symptoms get worse or do not improve with treatment.  You have mild eye pain.  You have sensitivity to light.  You have spots or blisters on your eyes.  You have pus draining from your eye.  You have a fever.  Get help right away if:  You have redness, swelling, or other symptoms in only one eye.  Your vision is blurred or you have vision changes.  You have severe eye pain. This information is not intended to replace advice given to you by your health care provider. Make sure you discuss any questions you have with your health care provider. Document Released:  08/19/2002 Document Revised: 01/26/2016 Document Reviewed: 12/10/2015 Elsevier Interactive Patient Education  2017 [employer].   IF you received an x-ray today, you will receive an invoice from [facility]. Please contact [facility] at [phone] with questions or concerns regarding your invoice.   IF you received labwork today, you will receive an invoice from [place]. Please contact [facility] at [phone] with questions or concerns regarding your invoice.   Our billing staff will not be able to assist you with questions regarding bills from these companies.  You will be contacted with the lab results as soon as they are available. The fastest way to get your results is to activate your My Chart account. Instructions are located on the last page of this paperwork. If you have not heard from [place] regarding the results in 2 weeks, please contact this office.

## 2016-10-25 NOTE — Progress Notes (Signed)
   MRN: [id number] DOB: Nov 03, 1975  Subjective:   Lori Hutchinson is a 41 y.o. female presenting for follow up on sinusitis, eye redness. Patient has been using Augmentin for her sinuses, Bacitracin for her eyes as prescribed. She did not tolerate tessalon very well. Admits improvement in her sinuses and right eye but now her left eye is red, itching. She also continues to have watery eyes. Still has mild throat discomfort. Denies fever, photophobia, pain with eye movement, sinus pain, matted eyes, ear pain, ear drainage. Denies smoking cigarettes.  Lori Hutchinson has a current medication list which includes the following prescription(s): amoxicillin-clavulanate, bacitracin-polymyxin b, benzonatate, and proair hfa. Also is allergic to erythromycin; percocet [oxycodone-acetaminophen]; shellfish allergy; iodine; and latex.  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 Also  has a past surgical history that includes Wisdom tooth extraction.  Objective:   Vitals: BP 108/72 (BP Location: Right Arm, Patient Position: Sitting, Cuff Size: Small)   Pulse 73   Temp 97.9 F (36.6 C) (Oral)   Resp 18   Ht 5' 4"  (1.626 m)   Wt 120 lb 9.6 oz (54.7 kg)   SpO2 98%   BMI 20.70 kg/m    Visual Acuity Screening   Right eye Left eye Both eyes  Without correction: 20/40 20/50 20/40   With correction:       Physical Exam  Constitutional: She is oriented to person, place, and time. She appears well-developed and well-nourished.  HENT:  TM's intact bilaterally, no effusions or erythema. Nasal turbinates pink and moist, nasal passages patent. No sinus tenderness. Oropharynx with moderate post-nasal drainage, mucous membranes moist.  Eyes: EOM are normal. Pupils are equal, round, and reactive to light. Right eye exhibits no discharge. Left eye exhibits no  discharge.  Eyes are watery bilaterally.  Neck: Normal range of motion. Neck supple.  Cardiovascular: Normal rate.   Pulmonary/Chest: Effort normal.  Lymphadenopathy:    She has no cervical adenopathy.  Neurological: She is alert and oriented to person, place, and time.  Skin: Skin is warm and dry.   Assessment and Plan :   1. Other acute sinusitis, recurrence not specified 2. Itch of left eye 3. Redness of eye, left - Sinusitis is improved. Stop bacitracin. Start Opcon A. Start Zyrtec to address allergy component.   Jaynee Eagles, PA-C [facility] [phone] 10/25/2016 2:28 PM

## 2016-10-27 ENCOUNTER — Ambulatory Visit: Payer: BC Managed Care – PPO | Admitting: Family Medicine

## 2016-11-07 ENCOUNTER — Encounter: Payer: Self-pay | Admitting: Urgent Care

## 2016-11-08 NOTE — Telephone Encounter (Signed)
Hi Tiasia,  For your ears, this can definitely be related to allergies and specifically, what you may be dealing with is eustachian tube dysfunction. Unfortunately, this is not a problem that can be resolved easily. Our medical research recommends taking allergy medications very consistently such as an oral antihistamine (Allegra, Zyrtec or Claritin) together with a nasal steroid (Flonase, Nasacort, Nasonex). On the plane, these symptoms can worsen due to the change in elevation and pressure of the air. I recommend you try a small dose of Sudafed (81m) to see if this helps. You can also try clearing your throat or chewing gum - although, these are not backed my medical research. I hope your symptoms fully resolve but we are here to help if they do not. Hope you get to [place] and that your trip isn't all work.  [place], MRosario Adie PA-C

## 2016-12-02 ENCOUNTER — Encounter: Payer: Self-pay | Admitting: Family Medicine

## 2016-12-06 ENCOUNTER — Other Ambulatory Visit: Payer: Self-pay | Admitting: Family Medicine

## 2016-12-06 DIAGNOSIS — R42 Dizziness and giddiness: Secondary | ICD-10-CM

## 2017-01-09 ENCOUNTER — Ambulatory Visit (INDEPENDENT_AMBULATORY_CARE_PROVIDER_SITE_OTHER): Payer: BC Managed Care – PPO | Admitting: Physician Assistant

## 2017-01-09 ENCOUNTER — Encounter: Payer: Self-pay | Admitting: Physician Assistant

## 2017-01-09 VITALS — BP 116/72 | HR 61 | Temp 98.0°F | Resp 17 | Ht 64.5 in | Wt 116.0 lb

## 2017-01-09 DIAGNOSIS — R5383 Other fatigue: Secondary | ICD-10-CM

## 2017-01-09 LAB — POC HEMOCCULT BLD/STL (OFFICE/1-CARD/DIAGNOSTIC): FECAL OCCULT BLD: NEGATIVE

## 2017-01-09 MED ORDER — HYDROCORTISONE ACETATE 25 MG RE SUPP: 25.0000 mg | Freq: Two times a day (BID) | RECTAL | 2 refills | Status: DC

## 2017-01-09 NOTE — Patient Instructions (Addendum)
I will contact you with the results of your blood work. I would like you to apply medication as prescribed.  You may need it for 1 week.   You can use tylenol or ibuprofen for pain temporarily (<7-10 days).  Hemorrhoids Hemorrhoids are swollen veins in and around the rectum or anus. Hemorrhoids can cause pain, itching, or bleeding. Most of the time, they do not cause serious problems. They usually get better with diet changes, lifestyle changes, and other home treatments. Follow these instructions at home: Eating and drinking  Eat foods that have fiber, such as whole grains, beans, nuts, fruits, and vegetables. Ask your doctor about taking products that have added fiber (fibersupplements).  Drink enough fluid to keep your pee (urine) clear or pale yellow. For Pain and Swelling  Take a warm-water bath (sitz bath) for 20 minutes to ease pain. Do this 3-4 times a day.  If directed, put ice on the painful area. It may be helpful to use ice between your warm baths. ? Put ice in a plastic bag. ? Place a towel between your skin and the bag. ? Leave the ice on for 20 minutes, 2-3 times a day. General instructions  Take over-the-counter and prescription medicines only as told by your doctor. ? Medicated creams and medicines that are inserted into the anus (suppositories) may be used or applied as told.  Exercise often.  Go to the bathroom when you have the urge to poop (to have a bowel movement). Do not wait.  Avoid pushing too hard (straining) when you poop.  Keep the butt area dry and clean. Use wet toilet paper or moist paper towels.  Do not sit on the toilet for a long time. Contact a doctor if:  You have any of these: ? Pain and swelling that do not get better with treatment or medicine. ? Bleeding that will not stop. ? Trouble pooping or you cannot poop. ? Pain or swelling outside the area of the hemorrhoids. This information is not intended to replace advice given to you by  your health care provider. Make sure you discuss any questions you have with your health care provider. Document Released: 03/07/2008 Document Revised: 11/04/2015 Document Reviewed: 02/10/2015 Elsevier Interactive Patient Education  2018 [employer].     IF you received an x-ray today, you will receive an invoice from [facility]. Please contact [facility] at [phone] with questions or concerns regarding your invoice.   IF you received labwork today, you will receive an invoice from [place]. Please contact [facility] at [phone] with questions or concerns regarding your invoice.   Our billing staff will not be able to assist you with questions regarding bills from these companies.  You will be contacted with the lab results as soon as they are available. The fastest way to get your results is to activate your My Chart account. Instructions are located on the last page of this paperwork. If you have not heard from [place] regarding the results in 2 weeks, please contact this office.

## 2017-01-09 NOTE — Progress Notes (Signed)
PRIMARY CARE AT [facility] [address] [place] [address] [phone]  Date:  01/09/2017   Name:  Kaden Dunkel   DOB:  Jun 01, 1976   MRN:  [id number]  PCP:  [name], MD    History of Present Illness:  Micaylah Bertucci is a 41 y.o. female patient who presents to PCP with  Chief Complaint  Patient presents with  . Rectal Bleeding     She complains of rectal bleeding, and anal pain.  There is no abdominal pain.  She is constipated--which she takes nothing for this.  She is eating a lot of fiber--eats plant-based moreso than anything.   She feels very fatigued.  She is eating no meat for the last 2 months.   No urinary pain, hematuria, or frequency.   She is eating consistently daily.   No heavy exercise.   No recent tic bites, and not outdoors a great deal.     Patient Active Problem List   Diagnosis Date Noted  . Sinus bradycardia   . 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 Anemia 12/20/2011  . Borderline personality disorder 12/20/2011  . 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 PMS (premenstrual syndrome) 12/20/2011    Past Medical History:  Diagnosis Date  . Abnormal Pap smear 1996   Treatment cryosurgery  . Asthma   . Bacterial infection   . H/O borderline personality disorder 12/23/10  . H/O metrorrhagia 06/09/11  . H/O varicella   . Yeast infection     Past Surgical History:  Procedure Laterality Date  . WISDOM TOOTH EXTRACTION      Social History  Substance Use Topics  . Smoking status: Former Smoker    Years: 13.00    Quit date: 06/18/2002  . Smokeless tobacco: Never Used  . Alcohol use No    Family History  Problem Relation Age of Onset  . Cancer Maternal Grandmother        Ovarian  . Asthma Mother   . Heart disease Father   . Cancer Father        Lung  . Cancer Paternal Grandmother        Colon  . Cancer Paternal Grandfather        Colon    Allergies  Allergen Reactions  . Erythromycin Nausea And Vomiting  . Percocet  [Oxycodone-Acetaminophen] Nausea And Vomiting  . Shellfish Allergy Nausea And Vomiting    Stomach burning-- pt was unable to eat for days following reaction   . Iodine Rash  . Latex Rash    Medication list has been reviewed and updated.  No current outpatient prescriptions on file prior to visit.   No current facility-administered medications on file prior to visit.     ROS ROS otherwise unremarkable unless listed above.  Physical Examination: BP 116/72   Pulse 61   Temp 98 F (36.7 C) (Oral)   Resp 17   Ht 5' 4.5" (1.638 m)   Wt 116 lb (52.6 kg)   LMP 10/10/2012 Comment: States that they just stopped.  SpO2 98%   BMI 19.60 kg/m  Ideal Body Weight: Weight in (lb) to have BMI = 25: 147.6  Physical Exam  Constitutional: She is oriented to person, place, and time. She appears well-developed and well-nourished. No distress.  HENT:  Head: Normocephalic and atraumatic.  Right Ear: External ear normal.  Left Ear: External ear normal.  Eyes: Pupils are equal, round, and reactive to light. Conjunctivae and EOM are normal.  Cardiovascular: Normal rate.  Pulmonary/Chest: Effort normal. No respiratory distress.  Genitourinary:  Genitourinary Comments: 6 oclock region with reducible hemorrhoid (non-thrombosed).    Neurological: She is alert and oriented to person, place, and time.  Skin: She is not diaphoretic.  Psychiatric: She has a normal mood and affect. Her behavior is normal.   Results for orders placed or performed in visit on 01/09/17  POC Hemoccult Bld/Stl (1-Cd Office Dx)  Result Value Ref Range   Card #1 Date 01-09-17    Fecal Occult Blood, POC Negative Negative     Assessment and Plan: Taiana Temkin is a 41 y.o. female who is here today for cc of rectal bleeding and fatigue. Other fatigue - Plan: CMP14+EGFR, Epstein-Barr virus VCA antibody panel, CBC with Differential/Platelet, TSH, HIV antibody, POC Hemoccult Bld/Stl (1-Cd Office Dx), hydrocortisone (ANUSOL-HC)  25 MG suppository, Vitamin B12  [name], PA-C [facility] 7/31/20188:53 PM

## 2017-01-10 LAB — CMP14+EGFR
A/G RATIO: 1.8 (ref 1.2–2.2)
ALK PHOS: 80 IU/L (ref 39–117)
ALT: 25 IU/L (ref 0–32)
AST: 23 IU/L (ref 0–40)
Albumin: 4.6 g/dL (ref 3.5–5.5)
BILIRUBIN TOTAL: 0.5 mg/dL (ref 0.0–1.2)
BUN/Creatinine Ratio: 15 (ref 9–23)
BUN: 13 mg/dL (ref 6–24)
CHLORIDE: 106 mmol/L (ref 96–106)
CO2: 21 mmol/L (ref 20–29)
Calcium: 9.1 mg/dL (ref 8.7–10.2)
Creatinine, Ser: 0.86 mg/dL (ref 0.57–1.00)
GFR calc Af Amer: 98 mL/min/{1.73_m2} (ref >59)
GFR calc non Af Amer: 85 mL/min/{1.73_m2} (ref >59)
Globulin, Total: 2.6 g/dL (ref 1.5–4.5)
Glucose: 80 mg/dL (ref 65–99)
POTASSIUM: 4.2 mmol/L (ref 3.5–5.2)
Sodium: 142 mmol/L (ref 134–144)
Total Protein: 7.2 g/dL (ref 6.0–8.5)

## 2017-01-10 LAB — EPSTEIN-BARR VIRUS VCA ANTIBODY PANEL
EBV NA IgG: 215 U/mL — ABNORMAL HIGH (ref 0.0–17.9)
EBV VCA IGG: 267 U/mL — AB (ref 0.0–17.9)

## 2017-01-10 LAB — VITAMIN B12: VITAMIN B 12: 1157 pg/mL (ref 232–1245)

## 2017-01-10 LAB — HIV ANTIBODY (ROUTINE TESTING W REFLEX): HIV SCREEN 4TH GENERATION: NONREACTIVE

## 2017-01-10 LAB — TSH: TSH: 1.98 u[IU]/mL (ref 0.450–4.500)

## 2017-01-16 ENCOUNTER — Encounter: Payer: Self-pay | Admitting: Physician Assistant

## 2017-01-22 ENCOUNTER — Encounter: Payer: Self-pay | Admitting: Family Medicine

## 2017-01-22 ENCOUNTER — Ambulatory Visit (INDEPENDENT_AMBULATORY_CARE_PROVIDER_SITE_OTHER): Payer: BC Managed Care – PPO | Admitting: Family Medicine

## 2017-01-22 VITALS — BP 114/75 | HR 65 | Temp 98.5°F | Resp 16 | Ht 64.5 in | Wt 115.0 lb

## 2017-01-22 DIAGNOSIS — H04203 Unspecified epiphora, bilateral lacrimal glands: Secondary | ICD-10-CM | POA: Diagnosis not present

## 2017-01-22 DIAGNOSIS — M79601 Pain in right arm: Secondary | ICD-10-CM | POA: Diagnosis not present

## 2017-01-22 DIAGNOSIS — R5383 Other fatigue: Secondary | ICD-10-CM | POA: Diagnosis not present

## 2017-01-22 DIAGNOSIS — Z8739 Personal history of other diseases of the musculoskeletal system and connective tissue: Secondary | ICD-10-CM | POA: Diagnosis not present

## 2017-01-22 DIAGNOSIS — H539 Unspecified visual disturbance: Secondary | ICD-10-CM

## 2017-01-22 DIAGNOSIS — R42 Dizziness and giddiness: Secondary | ICD-10-CM | POA: Diagnosis not present

## 2017-01-22 DIAGNOSIS — R2689 Other abnormalities of gait and mobility: Secondary | ICD-10-CM

## 2017-01-22 DIAGNOSIS — R531 Weakness: Secondary | ICD-10-CM

## 2017-01-22 NOTE — Progress Notes (Signed)
Subjective:  By signing my name below, I, Lori Hutchinson, attest that this documentation has been prepared under the direction and in the presence of Lori Ray, MD. Electronically Signed: [name], [place]. 01/22/2017 , 2:10 PM .  Patient was seen in Room 25 .   Patient ID: Lori Hutchinson, female    DOB: 1975-11-10, 41 y.o.   MRN: [id number] Chief Complaint  Patient presents with  . Eye Problem    blurry vision   HPI Lori Hutchinson is a 41 y.o. female She was seen on July 31st for fatigue, rectal bleeding and anal pain, evaluated by [name], PA-C. She was noted to have reducible hemorrhoid on exam, negative stool testing for Hutchinson at that time. She was prescribed anusol HC. She had normal B-12, CMP, TSH, and non-reactive HIV. EBV titer indicating past infection. She had been evaluated in the past for dizziness and fatigue. Neurology referral was placed based on MyChart message in June. She was referred to [name].   Fatigue She has history of rhabdomyolysis in Oct 2017 with some persistent fatigue but CPK have been overall reassuring on repeat CPK testing. Most recently 37 in Dec 2017.   She states her fatigue has gotten worse. Her muscles would still twitch intermittently. She's trying to go back to exercising, but she becomes extremely sore, describes "full body, hit by truck".   Eye disturbance  She went to the hospital last week in [place] on August 8th. She states she was driving from [place], and started to have blurry lens vision. Her vision worsened by seeing a wavy line going across, describes similar to [employer] messing up. By the time she pulled her car over, she could barely see her phone. She called her mother first, and then called 911 ambulance. She was taken to [facility].   She states her vision improved during EMS trip, about 30 minutes to 1 hour after initial occurrence. Her vision eventually returned back to baseline at the hospital. She had CMP  done, which was normal, CBC had normal hemoglobin, normal WBC, UA normal, and SG was 0.005. Her EKG and CT head were normal as well. She was recommended MRI, but not performed. She informs lack of peripheral vision at ER.   Allergies: She mentions allergies causing crusting around her eyes lately with watery eyes, worse at night. She doesn't wear corrective contact lenses or glasses ongoing for past 3 weeks. She has an appointment with optometrist tomorrow, [name], at [facility].   Headaches: She has periodical headaches. She's also felt weakness in her legs with trouble balancing and unsteadiness since Oct 2017 (10 months).   Right arm swelling She mentions right arm swelling over the area where had had IV placed in hospital. She's been placing a warm wash cloth over the area to reduce the swelling.   Weight Calories 1500-1600 daily. 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   Wt Readings from Last 3 Encounters:  01/22/17 115 lb (52.2 kg)  01/09/17 116 lb (52.6 kg)  10/25/16 120 lb 9.6 oz (54.7 kg)   Body mass index is 19.43 kg/m.   Depression with anxiety - stressed She states she didn't feel like she could do her job sufficiently anymore and planned to move in with her mother in [place]. However, she felt better, and already quit her job. She was a Pharmacist, hospital, as well as a Pharmacist, hospital for [employer]. She's been feeling financial stress. She's been able to sleep for 10 hours+ and take naps  throughout the day; then on some days, she can only sleep 4-5 hours. 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 She denies hallucinations or SI/HI.   Patient Active Problem List   Diagnosis Date Noted  . Sinus bradycardia   . 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 Anemia 12/20/2011  . Borderline personality disorder 12/20/2011  . 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 PMS (premenstrual syndrome) 12/20/2011   Past Medical History:    Diagnosis Date  . Abnormal Pap smear 1996   Treatment cryosurgery  . Asthma   . Bacterial infection   . H/O borderline personality disorder 12/23/10  . H/O metrorrhagia 06/09/11  . H/O varicella   . Yeast infection    Past Surgical History:  Procedure Laterality Date  . WISDOM TOOTH EXTRACTION     Allergies  Allergen Reactions  . Erythromycin Nausea And Vomiting  . Percocet [Oxycodone-Acetaminophen] Nausea And Vomiting  . Shellfish Allergy Nausea And Vomiting    Stomach burning-- pt was unable to eat for days following reaction   . Iodine Rash  . Latex Rash   Prior to Admission medications   Medication Sig Start Date End Date Taking? Authorizing Provider  hydrocortisone (ANUSOL-HC) 25 MG suppository Place 1 suppository (25 mg total) rectally 2 (two) times daily. 01/09/17   Joretta Bachelor, PA   Social History   Social History  . Marital status: Single    Spouse name: N/A  . Number of children: N/A  . Years of education: N/A   Occupational History  . Not on file.   Social History Main Topics  . Smoking status: Former Smoker    Years: 13.00    Quit date: 06/18/2002  . Smokeless tobacco: Never Used  . Alcohol use No  . Drug use: No  . Sexual activity: Yes    Partners: Male     Comment: lo loestrin   Other Topics Concern  . Not on file   Social History Narrative  . No narrative on file   Review of Systems  Constitutional: Negative for chills, fatigue, fever and unexpected weight change.  Eyes: Positive for discharge and visual disturbance.  Respiratory: Negative for cough.   Gastrointestinal: Negative for constipation, diarrhea, nausea and vomiting.  Skin: Negative for rash and wound.  Neurological: Positive for dizziness, weakness and headaches.  Psychiatric/Behavioral: Negative for hallucinations, self-injury and suicidal ideas. The patient is nervous/anxious.        Objective:   Physical Exam  Constitutional: She is oriented to person, place, and  time. She appears well-developed and well-nourished. No distress.  HENT:  Head: Normocephalic and atraumatic.  Eyes: Pupils are equal, round, and reactive to light. EOM are normal. Right eye exhibits no nystagmus. Left eye exhibits no nystagmus.  No diplopia with testing  Neck: Neck supple.  Cardiovascular: Normal rate.   Pulmonary/Chest: Effort normal. No respiratory distress.  Musculoskeletal: Normal range of motion.  Right arm: faint ecchymosis over the right flexor crease at her elbow; minimal soft tissue swelling over previous IV entrance site, negative cords  Neurological: She is alert and oriented to person, place, and time. She has normal strength. She displays a negative Romberg sign. Coordination and gait normal.  No pronator drift, normal heel-toe gait, normal finger-to-nose, equal strength in upper and lower extremities, no facial droop  Skin: Skin is warm and dry.  Psychiatric: She has a normal mood and affect. Her behavior is normal.  Nursing note and vitals reviewed.  Vitals:   01/22/17 1336  BP: 114/75  Pulse: 65  Resp: 16  Temp: 98.5 F (36.9 C)  TempSrc: Oral  SpO2: 99%  Weight: 115 lb (52.2 kg)  Height: 5' 4.5" (1.638 m)      Assessment & Plan:  Valicia Rief is a 41 y.o. female History of rhabdomyolysis - Plan: CK Visual disturbance Other fatigue Episodic weakness Balance problem Dizziness  - History of rhabdo, but has had some persistent fatigue since that time with normal CPK testing. Now with reported disturbance in vision and dizziness as above. The symptoms have improved, nonfocal neuro exam. Previously had been referred to neurology, but has not yet had an appointment.  -Recent CMP, B12, EBV, TSH, HIV testing were reassuring. EKG and CT of head were apparently checked in outside ER recently without acute findings.  -Has follow-up with ophthalmologist following day, depending on that visit and exam would consider possible MRI, but also can be  discussed with neuro. Will check into neuro appointment and see if we can get that done sooner.  -ER/RTC precautions if worsening symptoms  -With situational stressors, history of cyclothymia, depression/anxiety symptoms, could consider psychiatry evaluation if no other cause found of persistent fatigue or dizziness.   -Does have history of eating disorder, with some persistent weight focus, but denies recent worsening.  Recent labs as above reassuring, and reported EKG at ER ok. Discussed weight today versus prior 2 visits.  This is being monitored by her trainer, but would consider insufficient calories as contributor to fatigue if persists. Have discussed counseling prior.   Watery eyes  -Follow-up with ophthalmology as planned. Would recommend using eyedrops as prescribed by optho to lessen other cause of blurry vision.  Right arm pain  -Faint bruise at previous IV site on right antecubital fossa. Do not appreciate significant warmth or induration, less likely superficial thrombophlebitis, but can use warm compresses as needed. RTC precautions if worsening.  No orders of the defined types were placed in this encounter.  Patient Instructions    Keep appointment with [facility] associates to discuss eye watering symptoms and what approach to take with your drops. I would also like them to look at your peripheral vision and dilated exam if possible to look at back of eyes.   Warm compresses to the area on your arm from the IV, ibuprofen as needed. That should continue to improve this week. If increased swelling or redness, return for recheck.  Depending on your evaluation with ophthalmology tomorrow, would recommend possible MRI of your brain or that can be discussed with neurology. I will also have referral staff look into that appointment and try to get you into neurology this week if possible.  I will repeat your muscle enzyme test today.   Return to the clinic or go to the nearest  emergency room if any of your symptoms worsen or new symptoms occur.   IF you received an x-Hutchinson today, you will receive an invoice from [facility]. Please contact [facility]iology at [phone] with questions or concerns regarding your invoice.   IF you received labwork today, you will receive an invoice from [place]. Please contact [facility] at [phone] with questions or concerns regarding your invoice.   Our billing staff will not be able to assist you with questions regarding bills from these companies.  You will be contacted with the lab results as soon as they are available. The fastest way to get your results is to activate your My Chart account.  Instructions are located on the last page of this paperwork. If you have not heard from [place] regarding the results in 2 weeks, please contact this office.       I personally performed the services described in this documentation, which was scribed in my presence. The recorded information has been reviewed and considered for accuracy and completeness, addended by me as needed, and agree with information above.  Signed,   Lori Ray, MD Primary Care at [place].  01/24/17 2:32 PM

## 2017-01-22 NOTE — Patient Instructions (Addendum)
  Keep appointment with [facility] associates to discuss eye watering symptoms and what approach to take with your drops. I would also like them to look at your peripheral vision and dilated exam if possible to look at back of eyes.   Warm compresses to the area on your arm from the IV, ibuprofen as needed. That should continue to improve this week. If increased swelling or redness, return for recheck.  Depending on your evaluation with ophthalmology tomorrow, would recommend possible MRI of your brain or that can be discussed with neurology. I will also have referral staff look into that appointment and try to get you into neurology this week if possible.  I will repeat your muscle enzyme test today.   Return to the clinic or go to the nearest emergency room if any of your symptoms worsen or new symptoms occur.   IF you received an x-ray today, you will receive an invoice from [facility]. Please contact [facility] Radiology at [phone] with questions or concerns regarding your invoice.   IF you received labwork today, you will receive an invoice from [place]. Please contact [facility] at [phone] with questions or concerns regarding your invoice.   Our billing staff will not be able to assist you with questions regarding bills from these companies.  You will be contacted with the lab results as soon as they are available. The fastest way to get your results is to activate your My Chart account. Instructions are located on the last page of this paperwork. If you have not heard from [place] regarding the results in 2 weeks, please contact this office.

## 2017-01-23 ENCOUNTER — Ambulatory Visit: Payer: BC Managed Care – PPO | Admitting: Family Medicine

## 2017-01-23 LAB — CK: CK TOTAL: 61 U/L (ref 24–173)

## 2017-01-24 ENCOUNTER — Encounter: Payer: Self-pay | Admitting: Family Medicine

## 2017-02-10 ENCOUNTER — Encounter: Payer: Self-pay | Admitting: Family Medicine

## 2017-02-21 ENCOUNTER — Encounter: Payer: Self-pay | Admitting: Family Medicine

## 2017-02-22 ENCOUNTER — Ambulatory Visit (INDEPENDENT_AMBULATORY_CARE_PROVIDER_SITE_OTHER): Payer: BC Managed Care – PPO | Admitting: Family Medicine

## 2017-02-22 ENCOUNTER — Encounter: Payer: Self-pay | Admitting: Family Medicine

## 2017-02-22 VITALS — BP 105/69 | HR 67 | Temp 98.5°F | Resp 16 | Ht 64.0 in | Wt 115.0 lb

## 2017-02-22 DIAGNOSIS — R2689 Other abnormalities of gait and mobility: Secondary | ICD-10-CM | POA: Diagnosis not present

## 2017-02-22 DIAGNOSIS — H53432 Sector or arcuate defects, left eye: Secondary | ICD-10-CM | POA: Diagnosis not present

## 2017-02-22 DIAGNOSIS — R002 Palpitations: Secondary | ICD-10-CM | POA: Diagnosis not present

## 2017-02-22 DIAGNOSIS — Z8659 Personal history of other mental and behavioral disorders: Secondary | ICD-10-CM

## 2017-02-22 DIAGNOSIS — R519 Headache, unspecified: Secondary | ICD-10-CM

## 2017-02-22 DIAGNOSIS — R51 Headache: Secondary | ICD-10-CM

## 2017-02-22 DIAGNOSIS — Z8669 Personal history of other diseases of the nervous system and sense organs: Secondary | ICD-10-CM

## 2017-02-22 DIAGNOSIS — R42 Dizziness and giddiness: Secondary | ICD-10-CM

## 2017-02-22 LAB — POCT CBC
Granulocyte percent: 59.4 %G (ref 37–80)
HEMATOCRIT: 41.1 % (ref 37.7–47.9)
HEMOGLOBIN: 14 g/dL (ref 12.2–16.2)
LYMPH, POC: 1.2 (ref 0.6–3.4)
MCH, POC: 32.2 pg — AB (ref 27–31.2)
MCHC: 34 g/dL (ref 31.8–35.4)
MCV: 94.7 fL (ref 80–97)
MID (cbc): 0.4 (ref 0–0.9)
MPV: 7 fL (ref 0–99.8)
POC Granulocyte: 2.4 (ref 2–6.9)
POC LYMPH %: 30.2 % (ref 10–50)
POC MID %: 10.4 %M (ref 0–12)
Platelet Count, POC: 209 10*3/uL (ref 142–424)
RBC: 4.34 M/uL (ref 4.04–5.48)
RDW, POC: 12.3 %
WBC: 4.1 10*3/uL — AB (ref 4.6–10.2)

## 2017-02-22 LAB — GLUCOSE, POCT (MANUAL RESULT ENTRY): POC GLUCOSE: 80 mg/dL (ref 70–99)

## 2017-02-22 NOTE — Progress Notes (Signed)
Subjective:  By signing my name below, I, [name], attest that this documentation has been prepared under the direction and in the presence of Lori Ray, MD. Electronically Signed: [name], [place]. 02/22/2017 , 2:10 PM .  Patient was seen in Room 11 .   Patient ID: Lori Hutchinson, female    DOB: Apr 30, 1976, 41 y.o.   MRN: [id number] Chief Complaint  Patient presents with  . Follow-up    vision loss   HPI Lori Hutchinson is a 41 y.o. female  Here for follow up. Patient was last seen on Aug 13th, see details at that visit as well as [name]. In summary, she did have a transient left visual blurriness when driving on Aug 8th. She was transported to [facility] emergency room in [place], vision improved after 30 minutes to an hour, then back to baseline in ER. Her CBC normal with normal hemoglobin, normal WBC, UA normal, EKG and head CT normal. Discussed possible MRI as an outpatient. She had normal CMP, TSH, EBV titer indicating no recent infection, B-12 and non-reactive HIV on July 31st.   She has history of cyclothymia and eating disorder with persistent weight focus, but denies recent worsening. Of note, her weight today is the same as last visit at 115 lbs. Normal CPK at last visit (history of rhabdomyolysis) in Oct 2017.   She reported similar symptoms with transient left visual blurring around Sept 1st. She had been driving for about an hour, stopped at a store, returned to her car, then noticed things sort of disappearing in her left side vision. After a few minutes, she had flashing and splintering of what she was seeing, all on the left side still. This progressed to decreased vision but then resolved within 30-45 minutes.   Optho She had been having some allergy symptoms with crusting around her eyes. She had an appointment with optometrist, [name] at [facility]. She was having some headaches with episodic weakness and episodic balance issues with reported  unsteadiness since Oct 2017. On exam, she had normal strength, no pronator drift, no focal neurological findings.   Cardiology She was seen by cardiologist in Jan 2018. Of note, completely normal, unlikely cardiac abnormalities, consider possible echo. She notes echo was cost prohibited at this time. She reports having heart palpitations occasionally, even when she's in bed, like "it's turning over", occurs a couple times a month.   Family history Her grandmother had an aneurysm.  Her grandfather had a stroke.  Her aunt has some brain issues going on.   Today Patient reports most recent episode similar and same pattern as the first episode in [place] with similar time frame, but this time, seemed more intense. She described things were going dark, and disappearing; as well as fuzzy, and zigzag bright pattern with flashing- basically blind for about 10 minutes. She loses left visual field in both eyes. [name] was informed about these details but not the most recent episode. 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   Prior to the episodes occurring, she noticed feeling heavy, similar to low blood pressure and people felt "far away". She felt like, about 2 days prior to the episode. She also notes preceding with bad nights sleep. She denies migraine history. She's been having intermittent dizziness and unsteadiness throughout past year. She's felt like she's going to pass out, on average, about once a week. She snacks about every 2 hours, eating about 1300-1400 calories a day. She takes  Vitamin B-12, CoQ 10, and turmeric supplements. Her appointment with Neurology is on Oct 2nd.   Patient Active Problem List   Diagnosis Date Noted  . Sinus bradycardia   . 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 Anemia 12/20/2011  . Borderline personality disorder 12/20/2011  . 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 PMS (premenstrual syndrome) 12/20/2011   Past Medical  History:  Diagnosis Date  . Abnormal Pap smear 1996   Treatment cryosurgery  . Asthma   . Bacterial infection   . H/O borderline personality disorder 12/23/10  . H/O metrorrhagia 06/09/11  . H/O varicella   . Yeast infection    Past Surgical History:  Procedure Laterality Date  . WISDOM TOOTH EXTRACTION     Allergies  Allergen Reactions  . Erythromycin Nausea And Vomiting  . Percocet [Oxycodone-Acetaminophen] Nausea And Vomiting  . Shellfish Allergy Nausea And Vomiting    Stomach burning-- pt was unable to eat for days following reaction   . Iodine Rash  . Latex Rash   Prior to Admission medications   Not on File   Social History   Social History  . Marital status: Single    Spouse name: N/A  . Number of children: N/A  . Years of education: N/A   Occupational History  . Not on file.   Social History Main Topics  . Smoking status: Former Smoker    Years: 13.00    Quit date: 06/18/2002  . Smokeless tobacco: Never Used  . Alcohol use No  . Drug use: No  . Sexual activity: Yes    Partners: Male     Comment: lo loestrin   Other Topics Concern  . Not on file   Social History Narrative  . No narrative on file   Review of Systems  Constitutional: Negative for chills, fatigue, fever and unexpected weight change.  Eyes: Positive for visual disturbance.  Respiratory: Negative for cough.   Cardiovascular: Positive for palpitations.  Gastrointestinal: Negative for constipation, diarrhea, nausea and vomiting.  Skin: Negative for rash and wound.  Neurological: Positive for dizziness, weakness and headaches.  Psychiatric/Behavioral: The patient is nervous/anxious.        Objective:   Physical Exam  Constitutional: She is oriented to person, place, and time. She appears well-developed and well-nourished. No distress.  HENT:  Head: Normocephalic and atraumatic.  Eyes: Pupils are equal, round, and reactive to light. EOM are normal. Right eye exhibits no nystagmus.  Left eye exhibits no nystagmus.  Neck: Neck supple.  Cardiovascular: Normal rate.   Pulmonary/Chest: Effort normal. No respiratory distress.  Musculoskeletal: Normal range of motion.  Neurological: She is alert and oriented to person, place, and time. She displays a negative Romberg sign. Coordination and gait normal.  No pronator drift, no diplopia with testing, normal heel-to-toe gait, normal finger-to-nose, non focal neuro exam  Skin: Skin is warm and dry.  Psychiatric: She has a normal mood and affect. Her behavior is normal.  Nursing note and vitals reviewed.   Vitals:   02/22/17 1348  BP: 105/69  Pulse: 67  Resp: 16  Temp: 98.5 F (36.9 C)  TempSrc: Oral  SpO2: 99%  Weight: 115 lb (52.2 kg)  Height: 5' 4"  (1.626 m)   Results for orders placed or performed in visit on 02/22/17  POCT CBC  Result Value Ref Range   WBC 4.1 (A) 4.6 - 10.2 K/uL   Lymph, poc 1.2 0.6 - 3.4   POC LYMPH PERCENT 30.2 10 - 50 %L  MID (cbc) 0.4 0 - 0.9   POC MID % 10.4 0 - 12 %M   POC Granulocyte 2.4 2 - 6.9   Granulocyte percent 59.4 37 - 80 %G   RBC 4.34 4.04 - 5.48 M/uL   Hemoglobin 14.0 12.2 - 16.2 g/dL   HCT, POC 41.1 37.7 - 47.9 %   MCV 94.7 80 - 97 fL   MCH, POC 32.2 (A) 27 - 31.2 pg   MCHC 34.0 31.8 - 35.4 g/dL   RDW, POC 12.3 %   Platelet Count, POC 209 142 - 424 K/uL   MPV 7.0 0 - 99.8 fL  POCT glucose (manual entry)  Result Value Ref Range   POC Glucose 80 70 - 99 mg/dl     EKG: sinus bradycardia rate 54, no apparent changes from Jan 2018 EKG.     Assessment & Plan:   Kaileigh Viswanathan is a 41 y.o. female Sector visual field defect, left  Dizziness - Plan: POCT CBC, POCT glucose (manual entry), Basic metabolic panel, EKG [id number]  Balance problem - Plan: POCT CBC, Basic metabolic panel  Intermittent headache - Plan: Basic metabolic panel  H/O blurred vision - Plan: Basic metabolic panel  History of eating disorder  Intermittent palpitations - Plan: EKG 12-Lead  Few  episodes of left visual field change, with resolution in short time. Preceding symptoms and association with decreased sleep, differential includes ophthalmic migraine. Does report some intermittent dizziness, unsteadiness over past year. Rare palpitations, with prior cardiology workup reassuring. EKG without concerning findings in office. Does have history of eating disorder, but weight is stable, previous electrolytes were overall okay, and EKG again without acute findings. Nonfocal neurologic exam in office.  - Has appointment pending with neurology. We'll determine if MRI brain may be necessary prior to that appointment as that had been recommended after ER visit.  -Maintain sufficient calories throughout the day as well as hydration. Will repeat BMP to evaluate electrolytes.  -ER/RTC precautions if acute worsening or persistent symptoms  Meds ordered this encounter  Medications  . Olopatadine HCl (PAZEO) 0.7 % SOLN    Sig: Apply to eye.   Patient Instructions   I will check your electrolytes, but EKG appears unchanged from January. No sign of anemia or blood sugar issue in the office. I suspect an MRI of the brain may be needed but will discuss with neurology to see if we can get that done before your appointment. Make sure you're eating regular meals, drinking sufficient fluids throughout the day.   Return to the clinic or go to the nearest emergency room if any of your symptoms worsen or new symptoms occur.   IF you received an x-Hutchinson today, you will receive an invoice from [facility]. Please contact [facility] at [phone] with questions or concerns regarding your invoice.   IF you received labwork today, you will receive an invoice from [place]. Please contact [facility] at [phone] with questions or concerns regarding your invoice.   Our billing staff will not be able to assist you with questions regarding bills from these companies.  You will be contacted  with the lab results as soon as they are available. The fastest way to get your results is to activate your My Chart account. Instructions are located on the last page of this paperwork. If you have not heard from [place] regarding the results in 2 weeks, please contact this office.       I personally performed the services described in this  documentation, which was scribed in my presence. The recorded information has been reviewed and considered for accuracy and completeness, addended by me as needed, and agree with information above.  Signed,   Lori Ray, MD [facility].  02/24/17 2:28 PM

## 2017-02-22 NOTE — Patient Instructions (Addendum)
I will check your electrolytes, but EKG appears unchanged from January. No sign of anemia or blood sugar issue in the office. I suspect an MRI of the brain may be needed but will discuss with neurology to see if we can get that done before your appointment. Make sure you're eating regular meals, drinking sufficient fluids throughout the day.   Return to the clinic or go to the nearest emergency room if any of your symptoms worsen or new symptoms occur.   IF you received an x-ray today, you will receive an invoice from [facility]. Please contact [facility]iology at [phone] with questions or concerns regarding your invoice.   IF you received labwork today, you will receive an invoice from [facility]. Please contact [facility] at [phone] with questions or concerns regarding your invoice.   Our billing staff will not be able to assist you with questions regarding bills from these companies.  You will be contacted with the lab results as soon as they are available. The fastest way to get your results is to activate your My Chart account. Instructions are located on the last page of this paperwork. If you have not heard from [place] regarding the results in 2 weeks, please contact this office.

## 2017-02-23 ENCOUNTER — Encounter: Payer: Self-pay | Admitting: Family Medicine

## 2017-02-23 LAB — BASIC METABOLIC PANEL
BUN / CREAT RATIO: 12 (ref 9–23)
BUN: 10 mg/dL (ref 6–24)
CHLORIDE: 105 mmol/L (ref 96–106)
CO2: 24 mmol/L (ref 20–29)
Calcium: 9.6 mg/dL (ref 8.7–10.2)
Creatinine, Ser: 0.82 mg/dL (ref 0.57–1.00)
GFR, EST AFRICAN AMERICAN: 104 mL/min/{1.73_m2} (ref >59)
GFR, EST NON AFRICAN AMERICAN: 90 mL/min/{1.73_m2} (ref >59)
Glucose: 75 mg/dL (ref 65–99)
POTASSIUM: 4.7 mmol/L (ref 3.5–5.2)
SODIUM: 143 mmol/L (ref 134–144)

## 2017-03-09 ENCOUNTER — Encounter: Payer: Self-pay | Admitting: Family Medicine

## 2017-03-12 ENCOUNTER — Encounter: Payer: Self-pay | Admitting: Family Medicine

## 2017-03-13 ENCOUNTER — Encounter: Payer: Self-pay | Admitting: Neurology

## 2017-03-13 ENCOUNTER — Ambulatory Visit: Payer: BC Managed Care – PPO | Admitting: Neurology

## 2017-03-13 ENCOUNTER — Telehealth: Payer: Self-pay

## 2017-03-13 ENCOUNTER — Ambulatory Visit (INDEPENDENT_AMBULATORY_CARE_PROVIDER_SITE_OTHER): Payer: BC Managed Care – PPO | Admitting: Neurology

## 2017-03-13 VITALS — BP 103/70 | HR 56 | Ht 64.0 in | Wt 115.0 lb

## 2017-03-13 DIAGNOSIS — G43709 Chronic migraine without aura, not intractable, without status migrainosus: Secondary | ICD-10-CM | POA: Diagnosis not present

## 2017-03-13 DIAGNOSIS — R42 Dizziness and giddiness: Secondary | ICD-10-CM

## 2017-03-13 DIAGNOSIS — IMO0002 Reserved for concepts with insufficient information to code with codable children: Secondary | ICD-10-CM

## 2017-03-13 MED ORDER — RIZATRIPTAN BENZOATE 5 MG PO TBDP: 5.0000 mg | ORAL_TABLET | ORAL | 6 refills | Status: DC | PRN

## 2017-03-13 NOTE — Telephone Encounter (Signed)
I called pt. Dr. Rexene Hutchinson is sick and unable to se the pt. She is agreeable to seeing Dr. Krista Blue at 11:00am today, arrive at 10:30am. Pt verbalized understanding of new appt date and time.

## 2017-03-13 NOTE — Progress Notes (Signed)
PATIENT: Phoebie Shad DOB: Nov 12, 1975  Chief Complaint  Patient presents with  . Dizziness    Orthostatic Vitals: Lying: 103/70, 56, Sitting: 105/70, 67, Standing: 105/72, 56, Standing x 3 mintues: 103/72, 66.  Reports intermittent dizziness present for the last year.  Exercise makes it worse.    . Decreased Visual Acuity    States she had two episodes of visual disturbance where she lost part of her visual field and saw flashing lights.  Both episodes self resolved within one hour.    Marland Kitchen PCP    [name], MD     HISTORICAL  Naylea Wigington is a 41 years old female, seen in refer by her primary care doctor [name], for evaluation of decreased visual acuity, dizziness, initial evaluation was March 13 2017.  Reviewed and summarized the referring note, she had a history of rhabdomyolysis in October 2017, She had a history of anorexia nervosa and bulimia, sinus bradycardia, presented with elevated CPK level of 22,312 after workout at the gym, with right arm pain, this happened in the setting of dehydration, drink 90 ounces of coco, her symptoms has much improved with aggressive hydration.  Every since then, she felt fatigued easily, dizziness, lightheadedness sensation, she denies vertigo, denies hearing loss.  She has been physically active all her life, now she become much less active, she also reported 2 episodes of similar visual disturbance, the first episode was in August 2018 well she was driving, missing her meal, as soon as she stepped out from resting area to the bright sunshine, she noticed the left visual field deficit, kaleidoscope, flashing light in her left visual field, she has to pull over, called 911, went to the local emergency room, the whole episode lasted about 30 minutes, gradually resolved, she has mild right vortex parietal area headaches, per patient, CT head without contrast was reported normal.  4 weeks later in September 2018, similar things  happened again, she was driving in the brightness, had sudden onset left visual field deficit, kaleidoscope, lasting for 30 minutes, she was able to eat during the spells denies significant nausea, does have mild to moderate right parietal vortex area headaches.  As a kid, she could remember one episode of severe migraine headaches,  Her blood pressure is at the low normal range 103/70, heart rate of 56.  She drinks 2-3 Liter of water, used to drink up to 90 oz of soda.  REVIEW OF SYSTEMS: Full 14 system review of systems performed and notable only for fatigue, palpitation, spinning sensation, blurred vision, loss of vision, constipation, increased thirst, achy muscles, allergy, frequent infection, headaches, weakness, dizziness, insomnia, sleepiness  ALLERGIES: Allergies  Allergen Reactions  . Erythromycin Nausea And Vomiting  . Percocet [Oxycodone-Acetaminophen] Nausea And Vomiting  . Shellfish Allergy Nausea And Vomiting    Stomach burning-- pt was unable to eat for days following reaction   . Iodine Rash  . Latex Rash    HOME MEDICATIONS: Current Outpatient Prescriptions  Medication Sig Dispense Refill  . Olopatadine HCl (PAZEO) 0.7 % SOLN Apply to eye.     No current facility-administered medications for this visit.     PAST MEDICAL HISTORY: Past Medical History:  Diagnosis Date  . Abnormal Pap smear 1996   Treatment cryosurgery  . Asthma   . Bacterial infection   . Dizziness   . H/O borderline personality disorder 12/23/10  . H/O metrorrhagia 06/09/11  . H/O varicella   . Rhabdomyolysis   . Yeast  infection     PAST SURGICAL HISTORY: Past Surgical History:  Procedure Laterality Date  . WISDOM TOOTH EXTRACTION      FAMILY HISTORY: Family History  Problem Relation Age of Onset  . Cancer Maternal Grandmother        Ovarian  . Melanoma Maternal Grandmother   . Heart failure Maternal Grandmother   . Asthma Mother   . Cancer Father        Lung - age 38  .  Cancer Paternal Grandmother        Colon  . Melanoma Paternal Grandmother   . Cancer Paternal Grandfather        Colon  . Diabetes Paternal Grandfather   . Stroke Maternal Grandfather     SOCIAL HISTORY:  Social History   Social History  . Marital status: Single    Spouse name: N/A  . Number of children: 0  . Years of education: PhD   Occupational History  . Professor     [employer]   Social History Main Topics  . Smoking status: Former Smoker    Years: 13.00    Quit date: 06/18/2002  . Smokeless tobacco: Never Used  . Alcohol use No     Comment: Quit 2007  . Drug use: No     Comment: Quit 2009  . Sexual activity: Yes    Partners: Male     Comment: lo loestrin   Other Topics Concern  . Not on file   Social History Narrative   Lives at home alone.   Right-handed.   2-4 cups of tea, 1-2 sodas (20oz bottles) per day.     PHYSICAL EXAM   Vitals:   03/13/17 1157  BP: 103/70  Pulse: (!) 56  Weight: 115 lb (52.2 kg)  Height: 5' 4"  (1.626 m)    Not recorded      Body mass index is 19.74 kg/m.  PHYSICAL EXAMNIATION:  Gen: NAD, conversant, well nourised, obese, well groomed                     Cardiovascular: Regular rate rhythm, no peripheral edema, warm, nontender. Eyes: Conjunctivae clear without exudates or hemorrhage Neck: Supple, no carotid bruits. Pulmonary: Clear to auscultation bilaterally   NEUROLOGICAL EXAM:  MENTAL STATUS: Speech:    Speech is normal; fluent and spontaneous with normal comprehension.  Cognition:     Orientation to time, place and person     Normal recent and remote memory     Normal Attention span and concentration     Normal Language, naming, repeating,spontaneous speech     Fund of knowledge   CRANIAL NERVES: CN II: Visual fields are full to confrontation. Fundoscopic exam is normal with sharp discs and no vascular changes. Pupils are round equal and briskly reactive to light. CN III, IV, VI:  extraocular movement are normal. No ptosis. CN V: Facial sensation is intact to pinprick in all 3 divisions bilaterally. Corneal responses are intact.  CN VII: Face is symmetric with normal eye closure and smile. CN VIII: Hearing is normal to rubbing fingers CN IX, X: Palate elevates symmetrically. Phonation is normal. CN XI: Head turning and shoulder shrug are intact CN XII: Tongue is midline with normal movements and no atrophy.  MOTOR: There is no pronator drift of out-stretched arms. Muscle bulk and tone are normal. Muscle strength is normal.  REFLEXES: Reflexes are 2+ and symmetric at the biceps, triceps, knees, and ankles. Plantar responses are  flexor.  SENSORY: Intact to light touch, pinprick, positional sensation and vibratory sensation are intact in fingers and toes.  COORDINATION: Rapid alternating movements and fine finger movements are intact. There is no dysmetria on finger-to-nose and heel-knee-shin.    GAIT/STANCE: Posture is normal. Gait is steady with normal steps, base, arm swing, and turning. Heel and toe walking are normal. Tandem gait is normal.  Romberg is absent.   DIAGNOSTIC DATA (LABS, IMAGING, TESTING) - I reviewed patient records, labs, notes, testing and imaging myself where available.   ASSESSMENT AND PLAN  Annakate Soulier is a 41 y.o. female   Migraine with aura  I have suggested magnesium oxide 400 mg twice a day, riboflavin 100 mg twice a day she has frequent migraine  Otherwise she can try aspirin, NSAIDs, Tylenol as needed and also give her prescription Maxalt encased over-the-counter medicine does not help,  Dizziness   Most likely related to deconditioning, low blood pressure, I have suggested her to keep well hydration  Marcial Pacas, M.D. Ph.D.  [facility] [address], [place], [place] [address] Ph: [phone] Fax: [phone]  [id number], Ranell Patrick, MD

## 2017-03-13 NOTE — Patient Instructions (Signed)
Magnesium oxide 400 mg twice a day Riboflavin= vitamin B2 100 mg twice a day  Maxalt 5 mg as needed

## 2017-03-16 ENCOUNTER — Encounter: Payer: Self-pay | Admitting: Family Medicine

## 2017-03-16 ENCOUNTER — Ambulatory Visit (INDEPENDENT_AMBULATORY_CARE_PROVIDER_SITE_OTHER): Payer: BC Managed Care – PPO | Admitting: Family Medicine

## 2017-03-16 VITALS — BP 102/62 | HR 97 | Temp 98.0°F | Resp 16 | Ht 64.57 in | Wt 115.0 lb

## 2017-03-16 DIAGNOSIS — E875 Hyperkalemia: Secondary | ICD-10-CM | POA: Diagnosis not present

## 2017-03-16 NOTE — Progress Notes (Signed)
10/5/201812:47 PM  Lori Hutchinson 1976/04/01, 41 y.o. female [id number]  Chief Complaint  Patient presents with  . Labs Only    pt states she was seen at an Urgent Care and was told her potassium was high and needed to be rechecked    HPI:   Patient is a 41 y.o. female with past medical history significant for rhabdomyolysis who presents today requesting recheck of her potassium level.   She reports that 3 days ago she was exercising and felt RUE pain/tightness, she worried she might have developed rhabdomyolysis again so went to UC. She reports that her lab draw was difficult and they just called her today to let her know that her CK was normal but her K was 6.1.  Since she went to UC she has been pushing fluids, her arm does not hurt anymore. She denies any potassium containing supplements or use of sports drinks. She reports that she has chronic dizziness, lightheadedness and palpitations for which she just recently saw neuro and was told it was related to low BP and HR. She denies any acute worsening of these symptoms. She denies any chest pain.   Depression screen [facility] 2/9 03/16/2017 02/22/2017 01/22/2017  Decreased Interest 0 0 0  Down, Depressed, Hopeless 0 0 0  PHQ - 2 Score 0 0 0    Allergies  Allergen Reactions  . Erythromycin Nausea And Vomiting  . Percocet [Oxycodone-Acetaminophen] Nausea And Vomiting  . Shellfish Allergy Nausea And Vomiting    Stomach burning-- pt was unable to eat for days following reaction   . Iodine Rash  . Latex Rash    Prior to Admission medications   Medication Sig Start Date End Date Taking? Authorizing Provider  Olopatadine HCl (PAZEO) 0.7 % SOLN Apply to eye.   Yes [provider]  rizatriptan (MAXALT-MLT) 5 MG disintegrating tablet Take 1 tablet (5 mg total) by mouth as needed. May repeat in 2 hours if needed 03/13/17  Yes Marcial Pacas, MD    Past Medical History:  Diagnosis Date  . Abnormal Pap smear 1996   Treatment cryosurgery    . Asthma   . Bacterial infection   . Dizziness   . H/O borderline personality disorder 12/23/10  . H/O metrorrhagia 06/09/11  . H/O varicella   . Rhabdomyolysis   . Yeast infection     Past Surgical History:  Procedure Laterality Date  . WISDOM TOOTH EXTRACTION      Social History  Substance Use Topics  . Smoking status: Former Smoker    Years: 13.00    Quit date: 06/18/2002  . Smokeless tobacco: Never Used  . Alcohol use No     Comment: Quit 2007    Family History  Problem Relation Age of Onset  . Cancer Maternal Grandmother        Ovarian  . Melanoma Maternal Grandmother   . Heart failure Maternal Grandmother   . Asthma Mother   . Cancer Father        Lung - age 62  . Cancer Paternal Grandmother        Colon  . Melanoma Paternal Grandmother   . Cancer Paternal Grandfather        Colon  . Diabetes Paternal Grandfather   . Stroke Maternal Grandfather     Review of Systems  Constitutional: Negative for chills and fever.  Respiratory: Negative for cough and shortness of breath.   Cardiovascular: Negative for chest pain, palpitations and leg swelling.  Gastrointestinal: Negative  for abdominal pain, nausea and vomiting.     OBJECTIVE:  Blood pressure 102/62, pulse 97, temperature 98 F (36.7 C), temperature source Oral, resp. rate 16, height 5' 4.57" (1.64 m), weight 115 lb (52.2 kg), SpO2 97 %.  Physical Exam  Constitutional: She is oriented to person, place, and time and well-developed, well-nourished, and in no distress.  HENT:  Head: Normocephalic and atraumatic.  Mouth/Throat: Oropharynx is clear and moist. No oropharyngeal exudate.  Eyes: Pupils are equal, round, and reactive to light. EOM are normal. No scleral icterus.  Neck: Neck supple.  Cardiovascular: Normal rate, regular rhythm and normal heart sounds.  Exam reveals no gallop and no friction rub.   No murmur heard. Pulmonary/Chest: Effort normal and breath sounds normal. She has no wheezes. She  has no rales.  Musculoskeletal: She exhibits no edema.  Neurological: She is alert and oriented to person, place, and time. Gait normal.  Skin: Skin is warm and dry.    ASSESSMENT and PLAN  1. Hyperkalemia Rechecking BMP today, patient is asymptomatic. ER precautions given. Will call patient with lab results tomorrow morning. - Basic Metabolic Panel  Return if symptoms worsen or fail to improve.   Rutherford Guys, MD Primary Care at [place], [place] [address] Ph.  [phone] Fax [phone]

## 2017-03-16 NOTE — Patient Instructions (Signed)
     IF you received an x-ray today, you will receive an invoice from [facility]. Please contact [facility] at [phone] with questions or concerns regarding your invoice.   IF you received labwork today, you will receive an invoice from [facility]. Please contact [facility] at [phone] with questions or concerns regarding your invoice.   Our billing staff will not be able to assist you with questions regarding bills from these companies.  You will be contacted with the lab results as soon as they are available. The fastest way to get your results is to activate your My Chart account. Instructions are located on the last page of this paperwork. If you have not heard from us regarding the results in 2 weeks, please contact this office.     

## 2017-03-17 ENCOUNTER — Encounter: Payer: Self-pay | Admitting: Family Medicine

## 2017-03-17 LAB — BASIC METABOLIC PANEL
BUN/Creatinine Ratio: 13 (ref 9–23)
BUN: 10 mg/dL (ref 6–24)
CO2: 22 mmol/L (ref 20–29)
Calcium: 9.3 mg/dL (ref 8.7–10.2)
Chloride: 102 mmol/L (ref 96–106)
Creatinine, Ser: 0.77 mg/dL (ref 0.57–1.00)
GFR calc Af Amer: 112 mL/min/{1.73_m2} (ref >59)
GFR calc non Af Amer: 97 mL/min/{1.73_m2} (ref >59)
Glucose: 70 mg/dL (ref 65–99)
Potassium: 4.3 mmol/L (ref 3.5–5.2)
Sodium: 139 mmol/L (ref 134–144)

## 2017-08-21 ENCOUNTER — Other Ambulatory Visit: Payer: Self-pay

## 2017-08-21 ENCOUNTER — Encounter: Payer: Self-pay | Admitting: Family Medicine

## 2017-08-21 ENCOUNTER — Ambulatory Visit (INDEPENDENT_AMBULATORY_CARE_PROVIDER_SITE_OTHER): Payer: BC Managed Care – PPO | Admitting: Family Medicine

## 2017-08-21 VITALS — BP 100/62 | HR 82 | Temp 98.5°F | Resp 16 | Ht 64.76 in | Wt 120.0 lb

## 2017-08-21 DIAGNOSIS — Z1321 Encounter for screening for nutritional disorder: Secondary | ICD-10-CM

## 2017-08-21 DIAGNOSIS — Z130 Encounter for screening for diseases of the blood and blood-forming organs and certain disorders involving the immune mechanism: Secondary | ICD-10-CM

## 2017-08-21 DIAGNOSIS — R002 Palpitations: Secondary | ICD-10-CM

## 2017-08-21 DIAGNOSIS — Z1329 Encounter for screening for other suspected endocrine disorder: Secondary | ICD-10-CM | POA: Diagnosis not present

## 2017-08-21 DIAGNOSIS — J029 Acute pharyngitis, unspecified: Secondary | ICD-10-CM

## 2017-08-21 DIAGNOSIS — Z13228 Encounter for screening for other metabolic disorders: Secondary | ICD-10-CM

## 2017-08-21 DIAGNOSIS — J069 Acute upper respiratory infection, unspecified: Secondary | ICD-10-CM

## 2017-08-21 LAB — POC INFLUENZA A&B (BINAX/QUICKVUE)
INFLUENZA B, POC: NEGATIVE
Influenza A, POC: NEGATIVE

## 2017-08-21 LAB — POCT RAPID STREP A (OFFICE): RAPID STREP A SCREEN: NEGATIVE

## 2017-08-21 NOTE — Progress Notes (Signed)
Subjective:    Patient ID: Lori Hutchinson, female    DOB: July 16, 1975, 42 y.o.   MRN: [id number]  HPI Lori Hutchinson is a 42 y.o. female Presents today for: Chief Complaint  Patient presents with  . Fatigue    pt states she has been feeling weak and having bodyaches x 3 weeks   . Cough    with nasal drainage and some sore throat     Presents with cough, congestion, bodyache, generalized fatigue past few weeks. Started about 17 days ago -sore throat. No known fever. No known exudate. Had PND, some congestion, some cough. Felt some chest congestion. Symptoms were improving last weekend then felt worse yesterday evening - more sore throat, then chills last night. Heart racing last night, nauseous but no vomiting.  Sore throat feels better today. Felt some chest pain with heart racing.  Has had heart racing a few times per week past month or two. worse last night. No prior eval. Always have some stress - no change recently. Caffeine: 3-4 drinks per day. No syncope/near syncope. Met with cardiologist Dr. Acie Fredrickson in past but at time of Rhabdo. No echo. Heart feels like it flips at times - about once per week.   Teaching for work - multiple locations, [place].    Tx: heating pad  Less appetite recently, but has not been trying to lose weight or recent calorie restriction. 1400 calories per day, but not exercising.  Wt Readings from Last 3 Encounters:  08/21/17 120 lb (54.4 kg)  03/16/17 115 lb (52.2 kg)  03/13/17 115 lb (52.2 kg)    Patient Active Problem List   Diagnosis Date Noted  . Chronic migraine 03/13/2017  . Dizziness 03/13/2017  . Sinus bradycardia   . 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 Anemia 12/20/2011  . Borderline personality disorder ([place]) 12/20/2011  . 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 PMS (premenstrual syndrome) 12/20/2011   Past Medical History:  Diagnosis Date  . Abnormal Pap smear 1996   Treatment cryosurgery  . Asthma   . Bacterial infection    . Dizziness   . H/O borderline personality disorder 12/23/10  . H/O metrorrhagia 06/09/11  . H/O varicella   . Rhabdomyolysis   . Yeast infection    Past Surgical History:  Procedure Laterality Date  . WISDOM TOOTH EXTRACTION     Allergies  Allergen Reactions  . Erythromycin Nausea And Vomiting  . Percocet [Oxycodone-Acetaminophen] Nausea And Vomiting  . Shellfish Allergy Nausea And Vomiting    Stomach burning-- pt was unable to eat for days following reaction   . Iodine Rash  . Latex Rash   Prior to Admission medications   Not on File   Social History   Socioeconomic History  . Marital status: Single    Spouse name: Not on file  . Number of children: 0  . Years of education: PhD  . [facility] education level: Not on file  Social Needs  . Financial resource strain: Not on file  . Food insecurity - worry: Not on file  . Food insecurity - inability: Not on file  . Transportation needs - medical: Not on file  . Transportation needs - non-medical: Not on file  Occupational History  . 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 Smoking status: Former Smoker    Years: 13.00    Last attempt to quit: 06/18/2002    Years since quitting: 15.1  . Smokeless tobacco: Never Used  Substance and Sexual Activity  . Alcohol use: No    Alcohol/week: 0.0 oz    Comment: Quit 2007  . Drug use: No    Comment: Quit 2009  . Sexual activity: Yes    Partners: Male    Comment: lo loestrin  Other Topics Concern  . Not on file  Social History Narrative   Lives at home alone.   Right-handed.   2-4 cups of tea, 1-2 sodas (20oz bottles) per day.    Review of Systems As in HPI    Objective:   Physical Exam  Constitutional: She is oriented to person, place, and time. She appears well-developed and well-nourished. No distress.  HENT:  Head: Normocephalic and atraumatic.  Right Ear: Hearing, tympanic membrane, external ear and ear canal normal.  Left Ear:  Hearing, tympanic membrane, external ear and ear canal normal.  Nose: Nose normal.  Mouth/Throat: Oropharynx is clear and moist. No oropharyngeal exudate.  Eyes: Conjunctivae and EOM are normal. Pupils are equal, round, and reactive to light.  Neck: Carotid bruit is not present.  Cardiovascular: Normal rate, regular rhythm, normal heart sounds and intact distal pulses.  No murmur heard. Pulmonary/Chest: Effort normal and breath sounds normal. No respiratory distress. She has no wheezes. She has no rhonchi.  Abdominal: Soft. She exhibits no pulsatile midline mass. There is no tenderness.  Neurological: She is alert and oriented to person, place, and time.  Skin: Skin is warm and dry. No rash noted.  Psychiatric: She has a normal mood and affect. Her behavior is normal.  Vitals reviewed.  Vitals:   08/21/17 1542  BP: 100/62  Pulse: 82  Resp: 16  Temp: 98.5 F (36.9 C)  TempSrc: Oral  SpO2: 99%  Weight: 120 lb (54.4 kg)  Height: 5' 4.76" (1.645 m)    EKG: SR, no apparent change form prior.   Results for orders placed or performed in visit on 08/21/17  CBC  Result Value Ref Range   WBC 6.2 3.4 - 10.8 x10E3/uL   RBC 4.24 3.77 - 5.28 x10E6/uL   Hemoglobin 13.6 11.1 - 15.9 g/dL   Hematocrit 39.3 34.0 - 46.6 %   MCV 93 79 - 97 fL   MCH 32.1 26.6 - 33.0 pg   MCHC 34.6 31.5 - 35.7 g/dL   RDW 13.3 12.3 - 15.4 %   Platelets 249 150 - 379 Z30Q6/VH  Basic metabolic panel  Result Value Ref Range   Glucose 70 65 - 99 mg/dL   BUN 10 6 - 24 mg/dL   Creatinine, Ser 0.77 0.57 - 1.00 mg/dL   GFR calc non Af Amer 97 >59 mL/min/1.73   GFR calc Af Amer 112 >59 mL/min/1.73   BUN/Creatinine Ratio 13 9 - 23   Sodium 138 134 - 144 mmol/L   Potassium 3.8 3.5 - 5.2 mmol/L   Chloride 101 96 - 106 mmol/L   CO2 23 20 - 29 mmol/L   Calcium 9.4 8.7 - 10.2 mg/dL  TSH  Result Value Ref Range   TSH 2.720 0.450 - 4.500 uIU/mL  Magnesium  Result Value Ref Range   Magnesium 2.1 1.6 - 2.3 mg/dL    POCT rapid strep A  Result Value Ref Range   Rapid Strep A Screen Negative Negative  POC Influenza A&B(BINAX/QUICKVUE)  Result Value Ref Range   Influenza A, POC Negative Negative   Influenza B, POC Negative Negative       Assessment & Plan:   Lori Hutchinson is a 42  y.o. female Screening for endocrine, nutritional, metabolic and immunity disorder - Plan: Magnesium Heart palpitations - Plan: CBC, Basic metabolic panel, TSH, Magnesium, EKG 12-Lead  - screening labs above,reassuring. No concerning findings on EKG. Could discuss Holter monitor or other outpatient monitoring with cardiology. Has seen cardiology previously, she can call for appointment or referral if needed. Also recommended decreased amount of caffeine slightly to see if that also helps palpitations. RTC precautions given  Sore throat - Plan: POCT rapid strep A Acute upper respiratory infection - Plan: POC Influenza A&B(BINAX/QUICKVUE)  -negative flu, strep testing. Reassuring exam. CBC also reassuring.sore throat improving -  symptomatic care and RTC precautions were discussed at this point. If secondary sickening, consider antibiotics but at this point appears viral  No orders of the defined types were placed in this encounter.  Patient Instructions    I will check some bloodwork for your heart racing symptoms, but would consider meeting with cardiology to see if other testing such as outpatient or holter monitor may be needed. You can try decreasing caffeine by 1/4-1/3rd to see if that helps.   Return to the clinic or go to the nearest emergency room if any of your symptoms worsen or new symptoms occur.  Get plenty of rest, drink plenty of fluids. If sore throat does not continue to improve this week, then we can look at other treatment or possible antibiotic.  Return to the clinic or go to the nearest emergency room if any of your symptoms worsen or new symptoms occur.   Upper Respiratory Infection, Adult Most  upper respiratory infections (URIs) are caused by a virus. A URI affects the nose, throat, and upper air passages. The most common type of URI is often called "the common cold." Follow these instructions at home:  Take medicines only as told by your doctor.  Gargle warm saltwater or take cough drops to comfort your throat as told by your doctor.  Use a warm mist humidifier or inhale steam from a shower to increase air moisture. This may make it easier to breathe.  Drink enough fluid to keep your pee (urine) clear or pale yellow.  Eat soups and other clear broths.  Have a healthy diet.  Rest as needed.  Go back to work when your fever is gone or your doctor says it is okay. ? You may need to stay home longer to avoid giving your URI to others. ? You can also wear a face mask and wash your hands often to prevent spread of the virus.  Use your inhaler more if you have asthma.  Do not use any tobacco products, including cigarettes, chewing tobacco, or electronic cigarettes. If you need help quitting, ask your doctor. Contact a doctor if:  You are getting worse, not better.  Your symptoms are not helped by medicine.  You have chills.  You are getting more short of breath.  You have brown or red mucus.  You have yellow or brown discharge from your nose.  You have pain in your face, especially when you bend forward.  You have a fever.  You have puffy (swollen) neck glands.  You have pain while swallowing.  You have white areas in the back of your throat. Get help right away if:  You have very bad or constant: ? Headache. ? Ear pain. ? Pain in your forehead, behind your eyes, and over your cheekbones (sinus pain). ? Chest pain.  You have long-lasting (chronic) lung disease and any of the  following: ? Wheezing. ? Long-lasting cough. ? Coughing up blood. ? A change in your usual mucus.  You have a stiff neck.  You have changes in  your: ? Vision. ? Hearing. ? Thinking. ? Mood. This information is not intended to replace advice given to you by your health care provider. Make sure you discuss any questions you have with your health care provider. Document Released: 11/15/2007 Document Revised: 01/30/2016 Document Reviewed: 09/03/2013 Elsevier Interactive Patient Education  2018 [employer].  Palpitations A palpitation is the feeling that your heartbeat is irregular or is faster than normal. It may feel like your heart is fluttering or skipping a beat. Palpitations are usually not a serious problem. They may be caused by many things, including smoking, caffeine, alcohol, stress, and certain medicines. Although most causes of palpitations are not serious, palpitations can be a sign of a serious medical problem. In some cases, you may need further medical evaluation. Follow these instructions at home: Pay attention to any changes in your symptoms. Take these actions to help with your condition:  Avoid the following: ? Caffeinated coffee, tea, soft drinks, diet pills, and energy drinks. ? Chocolate. ? Alcohol.  Do not use any tobacco products, such as cigarettes, chewing tobacco, and e-cigarettes. If you need help quitting, ask your health care provider.  Try to reduce your stress and anxiety. Things that can help you relax include: ? Yoga. ? Meditation. ? Physical activity, such as swimming, jogging, or walking. ? Biofeedback. This is a method that helps you learn to use your mind to control things in your body, such as your heartbeats.  Get plenty of rest and sleep.  Take over-the-counter and prescription medicines only as told by your health care provider.  Keep all follow-up visits as told by your health care provider. This is important.  Contact a health care provider if:  You continue to have a fast or irregular heartbeat after 24 hours.  Your palpitations occur more often. Get help right away  if:  You have chest pain or shortness of breath.  You have a severe headache.  You feel dizzy or you faint. This information is not intended to replace advice given to you by your health care provider. Make sure you discuss any questions you have with your health care provider. Document Released: 05/26/2000 Document Revised: 11/01/2015 Document Reviewed: 02/11/2015 Elsevier Interactive Patient Education  2018 [employer].   IF you received an x-ray today, you will receive an invoice from [facility]. Please contact [facility] at [phone] with questions or concerns regarding your invoice.   IF you received labwork today, you will receive an invoice from [facility]. Please contact [facility] at [phone] with questions or concerns regarding your invoice.   Our billing staff will not be able to assist you with questions regarding bills from these companies.  You will be contacted with the lab results as soon as they are available. The fastest way to get your results is to activate your My Chart account. Instructions are located on the last page of this paperwork. If you have not heard from [place] regarding the results in 2 weeks, please contact this office.       Signed,   [name], MD Primary Care at [place].  08/21/17 5:18 PM

## 2017-08-21 NOTE — Patient Instructions (Addendum)
I will check some bloodwork for your heart racing symptoms, but would consider meeting with cardiology to see if other testing such as outpatient or holter monitor may be needed. You can try decreasing caffeine by 1/4-1/3rd to see if that helps.   Return to the clinic or go to the nearest emergency room if any of your symptoms worsen or new symptoms occur.  Get plenty of rest, drink plenty of fluids. If sore throat does not continue to improve this week, then we can look at other treatment or possible antibiotic.  Return to the clinic or go to the nearest emergency room if any of your symptoms worsen or new symptoms occur.   Upper Respiratory Infection, Adult Most upper respiratory infections (URIs) are caused by a virus. A URI affects the nose, throat, and upper air passages. The most common type of URI is often called "the common cold." Follow these instructions at home:  Take medicines only as told by your doctor.  Gargle warm saltwater or take cough drops to comfort your throat as told by your doctor.  Use a warm mist humidifier or inhale steam from a shower to increase air moisture. This may make it easier to breathe.  Drink enough fluid to keep your pee (urine) clear or pale yellow.  Eat soups and other clear broths.  Have a healthy diet.  Rest as needed.  Go back to work when your fever is gone or your doctor says it is okay. ? You may need to stay home longer to avoid giving your URI to others. ? You can also wear a face mask and wash your hands often to prevent spread of the virus.  Use your inhaler more if you have asthma.  Do not use any tobacco products, including cigarettes, chewing tobacco, or electronic cigarettes. If you need help quitting, ask your doctor. Contact a doctor if:  You are getting worse, not better.  Your symptoms are not helped by medicine.  You have chills.  You are getting more short of breath.  You have brown or red mucus.  You have  yellow or brown discharge from your nose.  You have pain in your face, especially when you bend forward.  You have a fever.  You have puffy (swollen) neck glands.  You have pain while swallowing.  You have white areas in the back of your throat. Get help right away if:  You have very bad or constant: ? Headache. ? Ear pain. ? Pain in your forehead, behind your eyes, and over your cheekbones (sinus pain). ? Chest pain.  You have long-lasting (chronic) lung disease and any of the following: ? Wheezing. ? Long-lasting cough. ? Coughing up blood. ? A change in your usual mucus.  You have a stiff neck.  You have changes in your: ? Vision. ? Hearing. ? Thinking. ? Mood. This information is not intended to replace advice given to you by your health care provider. Make sure you discuss any questions you have with your health care provider. Document Released: 11/15/2007 Document Revised: 01/30/2016 Document Reviewed: 09/03/2013 Elsevier Interactive Patient Education  2018 [employer].  Palpitations A palpitation is the feeling that your heartbeat is irregular or is faster than normal. It may feel like your heart is fluttering or skipping a beat. Palpitations are usually not a serious problem. They may be caused by many things, including smoking, caffeine, alcohol, stress, and certain medicines. Although most causes of palpitations are not serious, palpitations can be a  sign of a serious medical problem. In some cases, you may need further medical evaluation. Follow these instructions at home: Pay attention to any changes in your symptoms. Take these actions to help with your condition:  Avoid the following: ? Caffeinated coffee, tea, soft drinks, diet pills, and energy drinks. ? Chocolate. ? Alcohol.  Do not use any tobacco products, such as cigarettes, chewing tobacco, and e-cigarettes. If you need help quitting, ask your health care provider.  Try to reduce your stress and  anxiety. Things that can help you relax include: ? Yoga. ? Meditation. ? Physical activity, such as swimming, jogging, or walking. ? Biofeedback. This is a method that helps you learn to use your mind to control things in your body, such as your heartbeats.  Get plenty of rest and sleep.  Take over-the-counter and prescription medicines only as told by your health care provider.  Keep all follow-up visits as told by your health care provider. This is important.  Contact a health care provider if:  You continue to have a fast or irregular heartbeat after 24 hours.  Your palpitations occur more often. Get help right away if:  You have chest pain or shortness of breath.  You have a severe headache.  You feel dizzy or you faint. This information is not intended to replace advice given to you by your health care provider. Make sure you discuss any questions you have with your health care provider. Document Released: 05/26/2000 Document Revised: 11/01/2015 Document Reviewed: 02/11/2015 Elsevier Interactive Patient Education  2018 [employer].   IF you received an x-ray today, you will receive an invoice from [facility]. Please contact [facility] at [phone] with questions or concerns regarding your invoice.   IF you received labwork today, you will receive an invoice from [facility]. Please contact [facility] at [phone] with questions or concerns regarding your invoice.   Our billing staff will not be able to assist you with questions regarding bills from these companies.  You will be contacted with the lab results as soon as they are available. The fastest way to get your results is to activate your My Chart account. Instructions are located on the last page of this paperwork. If you have not heard from [place] regarding the results in 2 weeks, please contact this office.

## 2017-08-22 LAB — BASIC METABOLIC PANEL
BUN/Creatinine Ratio: 13 (ref 9–23)
BUN: 10 mg/dL (ref 6–24)
CALCIUM: 9.4 mg/dL (ref 8.7–10.2)
CHLORIDE: 101 mmol/L (ref 96–106)
CO2: 23 mmol/L (ref 20–29)
Creatinine, Ser: 0.77 mg/dL (ref 0.57–1.00)
GFR calc non Af Amer: 97 mL/min/{1.73_m2} (ref >59)
GFR, EST AFRICAN AMERICAN: 112 mL/min/{1.73_m2} (ref >59)
Glucose: 70 mg/dL (ref 65–99)
POTASSIUM: 3.8 mmol/L (ref 3.5–5.2)
Sodium: 138 mmol/L (ref 134–144)

## 2017-08-22 LAB — CBC
Hematocrit: 39.3 % (ref 34.0–46.6)
Hemoglobin: 13.6 g/dL (ref 11.1–15.9)
MCH: 32.1 pg (ref 26.6–33.0)
MCHC: 34.6 g/dL (ref 31.5–35.7)
MCV: 93 fL (ref 79–97)
PLATELETS: 249 10*3/uL (ref 150–379)
RBC: 4.24 x10E6/uL (ref 3.77–5.28)
RDW: 13.3 % (ref 12.3–15.4)
WBC: 6.2 10*3/uL (ref 3.4–10.8)

## 2017-08-22 LAB — MAGNESIUM: MAGNESIUM: 2.1 mg/dL (ref 1.6–2.3)

## 2017-08-22 LAB — TSH: TSH: 2.72 u[IU]/mL (ref 0.450–4.500)

## 2017-08-23 ENCOUNTER — Encounter: Payer: Self-pay | Admitting: Family Medicine

## 2017-08-23 ENCOUNTER — Other Ambulatory Visit: Payer: Self-pay

## 2017-08-23 ENCOUNTER — Ambulatory Visit: Payer: Self-pay | Admitting: *Deleted

## 2017-08-23 ENCOUNTER — Ambulatory Visit (INDEPENDENT_AMBULATORY_CARE_PROVIDER_SITE_OTHER): Payer: BC Managed Care – PPO | Admitting: Family Medicine

## 2017-08-23 ENCOUNTER — Telehealth: Payer: Self-pay | Admitting: Family Medicine

## 2017-08-23 VITALS — BP 100/82 | HR 82 | Temp 97.8°F | Ht 65.75 in | Wt 118.0 lb

## 2017-08-23 DIAGNOSIS — R0602 Shortness of breath: Secondary | ICD-10-CM

## 2017-08-23 DIAGNOSIS — R0982 Postnasal drip: Secondary | ICD-10-CM

## 2017-08-23 MED ORDER — ALBUTEROL SULFATE HFA 108 (90 BASE) MCG/ACT IN AERS: 2.0000 | INHALATION_SPRAY | Freq: Four times a day (QID) | RESPIRATORY_TRACT | 0 refills | Status: DC | PRN

## 2017-08-23 MED ORDER — IPRATROPIUM BROMIDE 0.03 % NA SOLN: 2.0000 | Freq: Two times a day (BID) | NASAL | 0 refills | Status: DC

## 2017-08-23 NOTE — Telephone Encounter (Signed)
Pt seen Tuesday by Dr. Carlota Raspberry for similar symptoms; reports "feeling worse today." Reports sore throat, > right sided with white patchs present, states these are new. Rapid strep neg.Tuesday. States SOB with exertion, when speaking; speech fluent, full sentences during triage call. Also reports chest tightness " with coughing."  Denies fever. Has been following home care instructions per Dr. Carlota Raspberry. Pt requests appt with an MD only. Declines UC. Appt made for 1400 with Dr. Pamella Pert. Instructed to call back if symptoms worsen prior to appt..  Reason for Disposition . [1] MILD difficulty breathing (e.g., minimal/no SOB at rest, SOB with walking, pulse <100) AND [2] NEW-onset or WORSE than normal  Answer Assessment - Initial Assessment Questions 1. RESPIRATORY STATUS: "Describe your breathing?" (e.g., wheezing, shortness of breath, unable to speak, severe coughing)      SOB with exertion, speaking. (speech fluent during call.) 2. ONSET: "When did this breathing problem begin?"      Last night 3. PATTERN "Does the difficult breathing come and go, or has it been constant since it started?"      With exertion,worse with coughing 4. SEVERITY: "How bad is your breathing?" (e.g., mild, moderate, severe)    - MILD: No SOB at rest, mild SOB with walking, speaks normally in sentences, can lay down, no retractions, pulse < 100.    - MODERATE: SOB at rest, SOB with minimal exertion and prefers to sit, cannot lie down flat, speaks in phrases, mild retractions, audible wheezing, pulse 100-120.    - SEVERE: Very SOB at rest, speaks in single words, struggling to breathe, sitting hunched forward, retractions, pulse > 120      Mild 5. RECURRENT SYMPTOM: "Have you had difficulty breathing before?" If so, ask: "When was the last time?" and "What happened that time?"      No 6. CARDIAC HISTORY: "Do you have any history of heart disease?" (e.g., heart attack, angina, bypass surgery, angioplasty)      No 7. LUNG  HISTORY: "Do you have any history of lung disease?"  (e.g., pulmonary embolus, asthma, emphysema)     H/O bronchitis per pt 8. CAUSE: "What do you think is causing the breathing problem?"      Congestion 9. OTHER SYMPTOMS: "Do you have any other symptoms? (e.g., dizziness, runny nose, cough, chest pain, fever)     Chest tightness 5/10, with coughing episodes. Sore throat, right side only with white patches. 10. PREGNANCY: "Is there any chance you are pregnant?" "When was your last menstrual period?"       no  Protocols used: BREATHING DIFFICULTY-A-AH

## 2017-08-23 NOTE — Telephone Encounter (Signed)
Left message that her request will be sent to Dr. Nyoka Cowden.

## 2017-08-23 NOTE — Telephone Encounter (Signed)
Copied from [place]. Topic: Quick Communication - See Telephone Encounter >> Aug 23, 2017  9:40 AM Neva Seat wrote: Pt called back to let Dr. Nyoka Cowden know she is feeling worse since Tuesday's visit.  She isn't getting better with the upper respiratory cold.   She is requesting an antibiotic be called into:  [id number], [place] [address] Phone: [phone] Fax: [phone]

## 2017-08-23 NOTE — Patient Instructions (Addendum)
1. Start using nasal saline washes twice a day 2.. Use a humidifier    IF you received an x-ray today, you will receive an invoice from [facility]. Please contact [facility] at [phone] with questions or concerns regarding your invoice.   IF you received labwork today, you will receive an invoice from [place]. Please contact [facility] at [phone] with questions or concerns regarding your invoice.   Our billing staff will not be able to assist you with questions regarding bills from these companies.  You will be contacted with the lab results as soon as they are available. The fastest way to get your results is to activate your My Chart account. Instructions are located on the last page of this paperwork. If you have not heard from [place] regarding the results in 2 weeks, please contact this office.

## 2017-08-23 NOTE — Progress Notes (Signed)
3/14/20192:20 PM  Lori Hutchinson Feb 12, 1976, 42 y.o. female [id number]  Chief Complaint  Patient presents with  . Cough    having chest discomfort and sob, with sore throat. did flu and throat swab at [address], not wanting any of those procedures repeated    HPI:   Patient is a 42 y.o. female who presents today for 3 weeks of post-nasal drainage, sore throat, mild cough with intermittent episodes of chest tightness with SOB. Yesterday and this morning were the worse.   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 At that time she was feeling so SOB and tight that she would get dizzy, albuterol and abx given.   She denies any sneezing, itchy eyes, ears or throat  She denies any fever or chills, sinus pain, ear pain, nausea or vomiting.   08/21/17 - flu test, rapid strep, cbc, bmp, tsh - normal  Depression screen [facility] 2/9 08/23/2017 08/21/2017 03/16/2017  Decreased Interest 0 0 0  Down, Depressed, Hopeless 0 0 0  PHQ - 2 Score 0 0 0    Allergies  Allergen Reactions  . Erythromycin Nausea And Vomiting  . Percocet [Oxycodone-Acetaminophen] Nausea And Vomiting  . Shellfish Allergy Nausea And Vomiting    Stomach burning-- pt was unable to eat for days following reaction   . Iodine Rash  . Latex Rash    Prior to Admission medications   Not on File    Past Medical History:  Diagnosis Date  . Abnormal Pap smear 1996   Treatment cryosurgery  . Asthma   . Bacterial infection   . Dizziness   . H/O borderline personality disorder 12/23/10  . H/O metrorrhagia 06/09/11  . H/O varicella   . Rhabdomyolysis   . Yeast infection     Past Surgical History:  Procedure Laterality Date  . WISDOM TOOTH EXTRACTION      Social History   Tobacco Use  . Smoking status: Former Smoker    Years: 13.00    Last attempt to quit: 06/18/2002    Years since quitting: 15.1  . Smokeless tobacco: Never Used  Substance Use Topics  . Alcohol use: No    Alcohol/week:  0.0 oz    Comment: Quit 2007    Family History  Problem Relation Age of Onset  . Cancer Maternal Grandmother        Ovarian  . Melanoma Maternal Grandmother   . Heart failure Maternal Grandmother   . Asthma Mother   . Cancer Father        Lung - age 60  . Cancer Paternal Grandmother        Colon  . Melanoma Paternal Grandmother   . Cancer Paternal Grandfather        Colon  . Diabetes Paternal Grandfather   . Stroke Maternal Grandfather     ROS Per hpi  OBJECTIVE:  Blood pressure 100/82, pulse 82, temperature 97.8 F (36.6 C), temperature source Oral, height 5' 5.75" (1.67 m), weight 118 lb (53.5 kg), SpO2 100 %.  Physical Exam  Constitutional: She is oriented to person, place, and time and well-developed, well-nourished, and in no distress.  HENT:  Head: Normocephalic and atraumatic.  Right Ear: Hearing, tympanic membrane, external ear and ear canal normal.  Left Ear: Hearing, tympanic membrane, external ear and ear canal normal.  Nares erythematous, mildly swollen, mucoid drainage. No sinus TTP OP with patches of erythema and cobblestoning.   Eyes: EOM are normal. Pupils are equal, round,  and reactive to light.  Neck: Neck supple.  Cardiovascular: Normal rate, regular rhythm and normal heart sounds. Exam reveals no gallop and no friction rub.  No murmur heard. Pulmonary/Chest: Effort normal and breath sounds normal. She has no wheezes. She has no rales.  Lymphadenopathy:    She has no cervical adenopathy.  Neurological: She is alert and oriented to person, place, and time. Gait normal.  Skin: Skin is warm and dry.     Peak flow reading is 350, about 82 % of predicted.   ASSESSMENT and PLAN  1. Postnasal drip 2. SOB (shortness of breath) Discussed supportive measures, new meds r/se/b and RTC precautions. Patient educational handout given.  Other orders - Check Peak Flow - ipratropium (ATROVENT) 0.03 % nasal spray; Place 2 sprays into both nostrils 2 (two)  times daily. - albuterol (PROVENTIL HFA;VENTOLIN HFA) 108 (90 Base) MCG/ACT inhaler; Inhale 2 puffs into the lungs every 6 (six) hours as needed for wheezing or shortness of breath.  Return in about 2 weeks (around 09/06/2017).    Rutherford Guys, MD [facility] at [place], [place] [address] Ph.  [phone] Fax [phone]

## 2017-08-24 NOTE — Telephone Encounter (Signed)
Patient seen in clinic yesterday 08/23/17 by Dr. Pamella Pert.

## 2017-08-26 ENCOUNTER — Encounter (HOSPITAL_COMMUNITY): Payer: Self-pay | Admitting: Emergency Medicine

## 2017-08-26 ENCOUNTER — Ambulatory Visit (HOSPITAL_COMMUNITY)
Admission: EM | Admit: 2017-08-26 | Discharge: 2017-08-26 | Disposition: A | Discharge status: Home / Self Care | Payer: BC Managed Care – PPO

## 2017-08-26 ENCOUNTER — Other Ambulatory Visit: Payer: Self-pay

## 2017-08-26 DIAGNOSIS — H5789 Other specified disorders of eye and adnexa: Secondary | ICD-10-CM

## 2017-08-26 DIAGNOSIS — H1031 Unspecified acute conjunctivitis, right eye: Secondary | ICD-10-CM | POA: Diagnosis not present

## 2017-08-26 DIAGNOSIS — J3089 Other allergic rhinitis: Secondary | ICD-10-CM

## 2017-08-26 MED ORDER — LORATADINE 10 MG PO TABS: 10.0000 mg | ORAL_TABLET | Freq: Every day | ORAL | 11 refills | Status: DC

## 2017-08-26 MED ORDER — POLYMYXIN B-TRIMETHOPRIM 10000-0.1 UNIT/ML-% OP SOLN: OPHTHALMIC | 0 refills | Status: DC

## 2017-08-26 NOTE — ED Provider Notes (Signed)
  MRN: [id number] DOB: February 19, 1976  Subjective:   Lori Hutchinson is a 42 y.o. female presenting for 1 day history of right eye redness, discomfort, drainage. Has also had congestion, runny nose, cough for the past 3 weeks. Has been seen twice for this and is taking albuterol, Atrovent nasal spray. Denies fever, eye pain, photophobia, eyelid swelling, pain with eye movement, sinus pain, eye trauma. Does not wear contacts. Uses reading glasses occasionally. Cannot tolerate erythromycin.  Lori Hutchinson is allergic to erythromycin; percocet [oxycodone-acetaminophen]; shellfish allergy; iodine; and latex.  Lori Hutchinson  has a past medical history of Abnormal Pap smear (1996), Asthma, Bacterial infection, Dizziness, H/O borderline personality disorder (12/23/10), H/O metrorrhagia (06/09/11), H/O varicella, Rhabdomyolysis, and Yeast infection. Also  has a past surgical history that includes Wisdom tooth extraction.  Objective:   Vitals: BP 122/79 (BP Location: Right Arm)   Pulse 91   Temp 98.4 F (36.9 C) (Oral)   Resp 16   SpO2 100%   Visual Acuity Bilateral Distance:20/30 R Distance:20/30 L Distance:20/50  Physical Exam  Constitutional: She is oriented to person, place, and time. She appears well-developed and well-nourished.  HENT:  Right Ear: Tympanic membrane normal.  Left Ear: Tympanic membrane normal.  Nose: No sinus tenderness.  Significant post-nasal drainage.  Eyes: EOM are normal. Pupils are equal, round, and reactive to light. Right eye exhibits discharge (yellow, thick). Left eye exhibits no discharge. No scleral icterus.  Neck: Normal range of motion. Neck supple.  Cardiovascular: Normal rate.  Pulmonary/Chest: Effort normal.  Lymphadenopathy:    She has no cervical adenopathy.  Neurological: She is alert and oriented to person, place, and time.  Skin: Skin is warm and dry.  Psychiatric: She has a normal mood and affect.   Assessment and Plan :   Acute bacterial conjunctivitis of right  eye  Redness of right eye  Non-seasonal allergic rhinitis due to other allergic trigger  Start Polytrim ophthalmic 4 times daily ~4-6 hours apart to right eye. Use Claritin to address rhinitis. Maintain albuterol and Atrovent. Return-to-clinic precautions discussed, patient verbalized understanding.    Jaynee Eagles, PA-C 08/26/17 1316

## 2017-08-26 NOTE — ED Triage Notes (Signed)
C/o right eye drainage and swelling onset last PM

## 2017-08-29 ENCOUNTER — Ambulatory Visit: Payer: Self-pay | Admitting: *Deleted

## 2017-08-29 ENCOUNTER — Ambulatory Visit (INDEPENDENT_AMBULATORY_CARE_PROVIDER_SITE_OTHER): Payer: BC Managed Care – PPO | Admitting: Family Medicine

## 2017-08-29 ENCOUNTER — Other Ambulatory Visit: Payer: Self-pay

## 2017-08-29 ENCOUNTER — Ambulatory Visit (INDEPENDENT_AMBULATORY_CARE_PROVIDER_SITE_OTHER): Payer: BC Managed Care – PPO

## 2017-08-29 ENCOUNTER — Encounter: Payer: Self-pay | Admitting: Family Medicine

## 2017-08-29 VITALS — BP 110/76 | HR 77 | Temp 97.9°F | Ht 65.35 in | Wt 117.6 lb

## 2017-08-29 DIAGNOSIS — J9801 Acute bronchospasm: Secondary | ICD-10-CM

## 2017-08-29 DIAGNOSIS — J0100 Acute maxillary sinusitis, unspecified: Secondary | ICD-10-CM | POA: Diagnosis not present

## 2017-08-29 MED ORDER — PREDNISONE 20 MG PO TABS: ORAL_TABLET | ORAL | 0 refills | Status: DC

## 2017-08-29 MED ORDER — AMOXICILLIN 500 MG PO TABS: 1000.0000 mg | ORAL_TABLET | Freq: Two times a day (BID) | ORAL | 0 refills | Status: DC

## 2017-08-29 NOTE — Patient Instructions (Addendum)
IF you received an x-ray today, you will receive an invoice from [facility]. Please contact [facility] at [phone] with questions or concerns regarding your invoice.   IF you received labwork today, you will receive an invoice from [place]. Please contact [facility] at [phone] with questions or concerns regarding your invoice.   Our billing staff will not be able to assist you with questions regarding bills from these companies.  You will be contacted with the lab results as soon as they are available. The fastest way to get your results is to activate your My Chart account. Instructions are located on the last page of this paperwork. If you have not heard from [place] regarding the results in 2 weeks, please contact this office.      Bronchospasm, Adult Bronchospasm is a tightening of the airways going into the lungs. During an episode, it may be harder to breathe. You may cough, and you may make a whistling sound when you breathe (wheeze). This condition often affects people with asthma. What are the causes? This condition is caused by swelling and irritation in the airways. It can be triggered by:  An infection (common).  Seasonal allergies.  An allergic reaction.  Exercise.  Irritants. These include pollution, cigarette smoke, strong odors, aerosol sprays, and paint fumes.  Weather changes. Winds increase molds and pollens in the air. Cold air may cause swelling.  Stress and emotional upset.  What are the signs or symptoms? Symptoms of this condition include:  Wheezing. If the episode was triggered by an allergy, wheezing may start right away or hours later.  Nighttime coughing.  Frequent or severe coughing with a simple cold.  Chest tightness.  Shortness of breath.  Decreased ability to exercise.  How is this diagnosed? This condition is usually diagnosed with a review of your medical history and a physical exam. Tests, such as lung  function tests, are sometimes done to look for other conditions. The need for a chest X-ray depends on where the wheezing occurs and whether it is the first time you have wheezed. How is this treated? This condition may be treated with:  Inhaled medicines. These open up the airways and help you breathe. They can be taken with an inhaler or a nebulizer device.  Corticosteroid medicines. These may be given for severe bronchospasm, usually when it is associated with asthma.  Avoiding triggers, such as irritants, infection, or allergies.  Follow these instructions at home: Medicines  Take over-the-counter and prescription medicines only as told by your health care provider.  If you need to use an inhaler or nebulizer to take your medicine, ask your health care provider to explain how to use it correctly. If you were given a spacer, always use it with your inhaler. Lifestyle  Reduce the number of triggers in your home. To do this: ? Change your heating and air conditioning filter at least once a month. ? Limit your use of fireplaces and wood stoves. ? Do not smoke. Do not allow smoking in your home. ? Avoid using perfumes and fragrances. ? Get rid of pests, such as roaches and mice, and their droppings. ? Remove any mold from your home. ? Keep your house clean and dust free. Use unscented cleaning products. ? Replace carpet with wood, tile, or vinyl flooring. Carpet can trap dander and dust. ? Use allergy-proof pillows, mattress covers, and box spring covers. ? Wash bed sheets and blankets every week in hot water. Dry them in  a dryer. ? Use blankets that are made of polyester or cotton. ? Wash your hands often. ? Do not allow pets in your bedroom.  Avoid breathing in cold air when you exercise. General instructions  Have a plan for seeking medical care. Know when to call your health care provider and local emergency services, and where to get emergency care.  Stay up to date on your  immunizations.  When you have an episode of bronchospasm, stay calm. Try to relax and breathe more slowly.  If you have asthma, make sure you have an asthma action plan.  Keep all follow-up visits as told by your health care provider. This is important. Contact a health care provider if:  You have muscle aches.  You have chest pain.  The mucus that you cough up (sputum) changes from clear or white to yellow, green, gray, or bloody.  You have a fever.  Your sputum gets thicker. Get help right away if:  Your wheezing and coughing get worse, even after you take your prescribed medicines.  It gets even harder to breathe.  You develop severe chest pain. Summary  Bronchospasm is a tightening of the airways going into the lungs.  During an episode of bronchospasm, you may have a harder time breathing. You may cough and make a whistling sound when you breathe (wheeze).  Avoid exposure to triggers such as smoke, dust, mold, animal dander, and fragrances.  When you have an episode of bronchospasm, stay calm. Try to relax and breathe more slowly. This information is not intended to replace advice given to you by your health care provider. Make sure you discuss any questions you have with your health care provider. Document Released: 06/01/2003 Document Revised: 05/25/2016 Document Reviewed: 05/25/2016 Elsevier Interactive Patient Education  2017 [employer].

## 2017-08-29 NOTE — Telephone Encounter (Addendum)
Pt called with complaints of shortness of breath (she says that she has been sick for 25 days); she says that she has used her inhaler numerous times and it is hard for her to talk or breath; she states that she is using her inhaler once daily; she also says that her symptoms are worse during the day; the pt requests to be seen in the office today by an MD; nurse triage initiatied and recommendations given per; pt offered and accepted appointment with Dr Reginia Forts at 1540 at [place], [place] [address]; she verbalizes understanding pt normally sees Dr Cindee Lame but he has no availability; will route to office for notification of this upcoming visit.   Reason for Disposition . [1] Continuous (nonstop) coughing AND [2] keeps from working or sleeping  Answer Assessment - Initial Assessment Questions 1. RESPIRATORY STATUS: "Describe your breathing?" (e.g., wheezing, shortness of breath, unable to speak, severe coughing)      Short of breath; hard to get air in 2. ONSET: "When did this breathing problem begin?"      Hard to say because she has been sick for so long 3. PATTERN "Does the difficult breathing come and go, or has it been constant since it started?"      Comes and goes 4. SEVERITY: "How bad is your breathing?" (e.g., mild, moderate, severe)    - MILD: No SOB at rest, mild SOB with walking, speaks normally in sentences, can lay down, no retractions, pulse < 100.    - MODERATE: SOB at rest, SOB with minimal exertion and prefers to sit, cannot lie down flat, speaks in phrases, mild retractions, audible wheezing, pulse 100-120.    - SEVERE: Very SOB at rest, speaks in single words, struggling to breathe, sitting hunched forward, retractions, pulse > 120      moderate 5. RECURRENT SYMPTOM: "Have you had difficulty breathing before?" If so, ask: "When was the last time?" and "What happened that time?"      Yes has been ongoing 6. CARDIAC HISTORY: "Do you have any history of heart disease?" (e.g.,  heart attack, angina, bypass surgery, angioplasty)     Evaluated for palpitations 7. LUNG HISTORY: "Do you have any history of lung disease?"  (e.g., pulmonary embolus, asthma, emphysema)     asthma 8. CAUSE: "What do you think is causing the breathing problem?"      unknown 9. OTHER SYMPTOMS: "Do you have any other symptoms? (e.g., dizziness, runny nose, cough, chest pain, fever)     Coughs a lot to keep airway open, runny nose. diaphgram is sore, chills 10. PREGNANCY: "Is there any chance you are pregnant?" "When was your last menstrual period?"       no 11. TRAVEL: "Have you traveled out of the country in the last month?" (e.g., travel history, exposures)       no  Protocols used: BREATHING DIFFICULTY-A-AH

## 2017-08-29 NOTE — Progress Notes (Signed)
Subjective:    Patient ID: Lori Hutchinson, female    DOB: 1976-03-30, 42 y.o.   MRN: [id number]  08/29/2017  Shortness of Breath (chest soar, heard to talk and hard to breath. Chills, mild cough and congestion (Ongoing 25 day and counting))    HPI This 42 y.o. female presents for evaluation of persistent cough.   Onset 25 days ago; started with mild cold; improved and then worsened and then worsened.  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  Gave Albuterol inhaler at last visit.  Also diagnosed with bacterial conjunctivitis.  Daytime is worse for breathing. Talking is the hardest; everything is swollen; diaphragm is swollen.  Albuterol HFA;  Using inhaler once daily.  Has insomnia chronic.  Atrovent nasal spray; stopped using it when started Claritin.   Started Claritin and Polytrim.   Sore throat is better. Losing weight with acute illness.   BP Readings from Last 3 Encounters:  10/05/17 110/60  09/07/17 108/80  08/29/17 110/76   Wt Readings from Last 3 Encounters:  10/05/17 117 lb 9.6 oz (53.3 kg)  09/07/17 116 lb (52.6 kg)  08/29/17 117 lb 9.6 oz (53.3 kg)    There is no immunization history on file for this patient.  Review of Systems  Constitutional: Negative for chills, diaphoresis, fatigue and fever.  HENT: Positive for congestion, postnasal drip and rhinorrhea. Negative for ear pain, sinus pressure, sinus pain, sneezing, sore throat, trouble swallowing and voice change.   Eyes: Negative for visual disturbance.  Respiratory: Positive for cough, shortness of breath and wheezing.   Cardiovascular: Negative for chest pain, palpitations and leg swelling.  Gastrointestinal: Negative for abdominal pain, constipation, diarrhea, nausea and vomiting.  Endocrine: Negative for cold intolerance, heat intolerance, polydipsia, polyphagia and polyuria.  Neurological: Negative for dizziness, tremors, seizures, syncope, facial  asymmetry, speech difficulty, weakness, light-headedness, numbness and headaches.    Past Medical History:  Diagnosis Date  . Abnormal Pap smear 1996   Treatment cryosurgery  . Asthma   . Bacterial infection   . Dizziness   . H/O borderline personality disorder 12/23/10  . H/O metrorrhagia 06/09/11  . H/O varicella   . Rhabdomyolysis   . Yeast infection    Past Surgical History:  Procedure Laterality Date  . WISDOM TOOTH EXTRACTION     Allergies  Allergen Reactions  . Erythromycin Nausea And Vomiting  . Percocet [Oxycodone-Acetaminophen] Nausea And Vomiting  . Shellfish Allergy Nausea And Vomiting    Stomach burning-- pt was unable to eat for days following reaction   . Iodine Rash  . Latex Rash   No current outpatient medications on file prior to visit.   No current facility-administered medications on file prior to visit.    Social History   Socioeconomic History  . Marital status: Single    Spouse name: Not on file  . Number of children: 0  . Years of education: PhD  . Highest education level: Not on file  Occupational History  . Occupation: Professor    Comment: Hydrologist / [employer]  Social Needs  . Financial resource strain: Not on file  . Food insecurity:    Worry: Not on file    Inability: Not on file  . Transportation needs:    Medical: Not on file    Non-medical: Not on file  Tobacco Use  . Smoking status: Former Smoker    Years: 13.00    Last attempt  to quit: 06/18/2002    Years since quitting: 15.3  . Smokeless tobacco: Never Used  Substance and Sexual Activity  . Alcohol use: No    Alcohol/week: 0.0 oz    Comment: Quit 2007  . Drug use: No    Comment: Quit 2009  . Sexual activity: Yes    Partners: Male    Comment: lo loestrin  Lifestyle  . Physical activity:    Days per week: Not on file    Minutes per session: Not on file  . Stress: Not on file  Relationships  . Social connections:    Talks on phone: Not on file    Gets together:  Not on file    Attends religious service: Not on file    Active member of club or organization: Not on file    Attends meetings of clubs or organizations: Not on file    Relationship status: Not on file  . Intimate partner violence:    Fear of current or ex partner: Not on file    Emotionally abused: Not on file    Physically abused: Not on file    Forced sexual activity: Not on file  Other Topics Concern  . Not on file  Social History Narrative   Lives at home alone.   Right-handed.   2-4 cups of tea, 1-2 sodas (20oz bottles) per day.   Family History  Problem Relation Age of Onset  . Cancer Maternal Grandmother        Ovarian  . Melanoma Maternal Grandmother   . Heart failure Maternal Grandmother   . Asthma Mother   . Cancer Father        Lung - age 18  . Cancer Paternal Grandmother        Colon  . Melanoma Paternal Grandmother   . Cancer Paternal Grandfather        Colon  . Diabetes Paternal Grandfather   . Stroke Maternal Grandfather        Objective:    BP 110/76 (BP Location: Left Arm, Patient Position: Sitting, Cuff Size: Normal)   Pulse 77   Temp 97.9 F (36.6 C) (Oral)   Ht 5' 5.35" (1.66 m)   Wt 117 lb 9.6 oz (53.3 kg)   BMI 19.36 kg/m  Physical Exam  Constitutional: She is oriented to person, place, and time. She appears well-developed and well-nourished. No distress.  HENT:  Head: Normocephalic and atraumatic.  Right Ear: External ear normal.  Left Ear: External ear normal.  Nose: Nose normal.  Mouth/Throat: Oropharynx is clear and moist.  Eyes: Conjunctivae and EOM are normal. Pupils are equal, round, and reactive to light.  Neck: Normal range of motion. Neck supple. Carotid bruit is not present. No thyromegaly present.  Cardiovascular: Normal rate, regular rhythm, normal heart sounds and intact distal pulses. Exam reveals no gallop and no friction rub.  No murmur heard. Pulmonary/Chest: Effort normal and breath sounds normal. No respiratory  distress. She has no wheezes. She has no rales.  Abdominal: Soft. Bowel sounds are normal. She exhibits no distension and no mass. There is no tenderness. There is no rebound and no guarding.  Lymphadenopathy:    She has no cervical adenopathy.  Neurological: She is alert and oriented to person, place, and time. No cranial nerve deficit.  Skin: Skin is warm and dry. No rash noted. She is not diaphoretic. No erythema. No pallor.  Psychiatric: She has a normal mood and affect. Her behavior is normal.  No results found. Depression screen [facility] 2/9 10/05/2017 09/07/2017 08/29/2017 08/23/2017 08/21/2017  Decreased Interest 0 0 0 0 0  Down, Depressed, Hopeless 0 0 0 0 0  PHQ - 2 Score 0 0 0 0 0   Fall Risk  10/05/2017 09/07/2017 08/29/2017 08/23/2017 08/21/2017  Falls in the past year? No No No No No        Assessment & Plan:   1. Bronchospasm   2. Acute non-recurrent maxillary sinusitis     Persistent cough due to underlying bronchospasm; treat with Prednisone.  Continue Albuterol every 6 hours scheduled for one week and then PRN. Obtain CXR to rule out infiltrate.  Acute sinusitis: New.  rx for Amoxicillin and Prednisone provided.   Orders Placed This Encounter  Procedures  . DG Chest 2 View    Standing Status:   Future    Number of Occurrences:   1    Standing Expiration Date:   08/29/2018    Order Specific Question:   Reason for Exam (SYMPTOM  OR DIAGNOSIS REQUIRED)    Answer:   cough for 3 weeks; SOB    Order Specific Question:   Is the patient pregnant?    Answer:   No    Order Specific Question:   Preferred imaging location?    Answer:   External   Meds ordered this encounter  Medications  . DISCONTD: amoxicillin (AMOXIL) 500 MG tablet    Sig: Take 2 tablets (1,000 mg total) by mouth 2 (two) times daily.    Dispense:  40 tablet    Refill:  0  . DISCONTD: predniSONE (DELTASONE) 20 MG tablet    Sig: One tablet daily x 10 days    Dispense:  10 tablet    Refill:  0    No  follow-ups on file.   Kristi Elayne Guerin, M.D. Primary Care at [facility] [address] [place], [place]  [address] [phone] phone [phone] fax

## 2017-08-30 ENCOUNTER — Telehealth: Payer: Self-pay | Admitting: Family Medicine

## 2017-08-30 NOTE — Telephone Encounter (Signed)
Copied from [place] [id number]. Topic: Quick Communication - See Telephone Encounter >> Aug 30, 2017  3:08 PM Ether Griffins B wrote: CRM for notification. See Telephone encounter for: 08/30/17. Pt calling about chest xray done on 08/29/17. Ok to leave voicemail.

## 2017-08-31 NOTE — Telephone Encounter (Signed)
Provider, please advise.

## 2017-09-04 NOTE — Telephone Encounter (Signed)
MyChart message sent on 08/31/17 to patient; patient reviewed same day.

## 2017-09-06 ENCOUNTER — Ambulatory Visit: Payer: Self-pay

## 2017-09-06 NOTE — Telephone Encounter (Signed)
Pt message sent to Dr. Tamala Julian as pt req.   Has appt with [place] tomorrow.

## 2017-09-06 NOTE — Telephone Encounter (Signed)
Pt. Called to report she is having side effects form the antibiotic.  C/o weakness, tired, upset stomach with diarrhea and cramping.  Described feeling "stabbing pains" in stomach.  Is asking if she can just stop the antibiotic? Stated her resp. symptoms are improving; denied chills, congestion is better.  Stated has occasional shortness of breath;  "I feel there is an allergy/ asthma component to my shortness of breath."   Stated she still feels an intermittent chest tightness, but feels this has improved.  Stated she did not start the Prednisone, because she was concerned about the side effects.  Questioned about recommendations to get her stomach back to normal;  recommended to eat yogurt for probiotic effect.  Stated she has been taking a probiotic.  Also, reported she may start activated Charcoal Tablets for her upset stomach, as she has used this in the past. Reported she has an appt. tomorrow with Dr. Pamella Pert.  Stated "I was hoping to get some advice today, to stop the antibiotic, since it is making my stomach so uncomfortable."  Advised will send a note to Dr. Tamala Julian, for any further recommendations.   Pt. Agrees with plan.           ----- Message from Cecelia Byars, NT sent at 09/06/2017 11:50 AM EDT ----- The patient called and would like to know if she can stop taking amoxicillin (AMOXIL) 500 MG tablet the dose is 2 pills twice a day, she i is causing cramping bloating ,diarrhea ,nausea ,and she has 4 doses left Please leave a detailed message on her voicemail at [phone] if no answer, if there are any suggestions to offset her stomach issues please leave that information as well

## 2017-09-07 ENCOUNTER — Encounter: Payer: Self-pay | Admitting: Family Medicine

## 2017-09-07 ENCOUNTER — Other Ambulatory Visit: Payer: Self-pay

## 2017-09-07 ENCOUNTER — Ambulatory Visit: Payer: BC Managed Care – PPO | Admitting: Family Medicine

## 2017-09-07 VITALS — BP 108/80 | HR 83 | Temp 98.6°F | Ht 64.0 in | Wt 116.0 lb

## 2017-09-07 DIAGNOSIS — J9801 Acute bronchospasm: Secondary | ICD-10-CM

## 2017-09-07 NOTE — Progress Notes (Signed)
3/29/20191:42 PM  Alizah Sills [birth year], 42 y.o. female [id number]  Chief Complaint  Patient presents with  . Follow-up    feeling a lot better. has 2 doses of anitbiotic to complete.    HPI:   Patient is a 42 y.o. female who presents today for followup.  She was seen by me on 08/23/17 for cough and SOB which significantly worsened and was seen again by another provider on 08/29/17. Prescribed 10 days of amoxicillin and prednisone burst. Normal cxr. She also was seen in between those visits for a bacterial conjunctivitis.  She has almost completed abx, never started prednisone. States that for the past 2 days she has finally been feeling well. She states she is worried because their is mold in her apartment and she can tell the difference in her breathing when she is in it versus outside. She thinks she will be moving to [place] in the summer for a new job. Even if this does not follow thru she plans on moving.   She has no acute concerns today  Depression screen [facility] 2/9 09/07/2017 08/29/2017 08/23/2017  Decreased Interest 0 0 0  Down, Depressed, Hopeless 0 0 0  PHQ - 2 Score 0 0 0    Allergies  Allergen Reactions  . Erythromycin Nausea And Vomiting  . Percocet [Oxycodone-Acetaminophen] Nausea And Vomiting  . Shellfish Allergy Nausea And Vomiting    Stomach burning-- pt was unable to eat for days following reaction   . Iodine Rash  . Latex Rash    Prior to Admission medications   Medication Sig Start Date End Date Taking? Authorizing Provider  albuterol (PROVENTIL HFA;VENTOLIN HFA) 108 (90 Base) MCG/ACT inhaler Inhale 2 puffs into the lungs every 6 (six) hours as needed for wheezing or shortness of breath. 08/23/17  Yes Rutherford Guys, MD  amoxicillin (AMOXIL) 500 MG tablet Take 2 tablets (1,000 mg total) by mouth 2 (two) times daily. 08/29/17  Yes Wardell Honour, MD  ipratropium (ATROVENT) 0.03 % nasal spray Place 2 sprays into both nostrils 2 (two) times daily. 08/23/17  Yes  Rutherford Guys, MD  loratadine (CLARITIN) 10 MG tablet Take 1 tablet (10 mg total) by mouth daily. 08/26/17  Yes Jaynee Eagles, PA-C    Past Medical History:  Diagnosis Date  . Abnormal Pap smear 1996   Treatment cryosurgery  . Asthma   . Bacterial infection   . Dizziness   . H/O borderline personality disorder 12/23/10  . H/O metrorrhagia 06/09/11  . H/O varicella   . Rhabdomyolysis   . Yeast infection     Past Surgical History:  Procedure Laterality Date  . WISDOM TOOTH EXTRACTION      Social History   Tobacco Use  . Smoking status: Former Smoker    Years: 13.00    Last attempt to quit: 06/18/2002    Years since quitting: 15.2  . Smokeless tobacco: Never Used  Substance Use Topics  . Alcohol use: No    Alcohol/week: 0.0 oz    Comment: Quit 2007    Family History  Problem Relation Age of Onset  . Cancer Maternal Grandmother        Ovarian  . Melanoma Maternal Grandmother   . Heart failure Maternal Grandmother   . Asthma Mother   . Cancer Father        Lung - age 8  . Cancer Paternal Grandmother        Colon  . Melanoma Paternal Grandmother   .  Cancer Paternal Grandfather        Colon  . Diabetes Paternal Grandfather   . Stroke Maternal Grandfather     ROS Per hpi  OBJECTIVE:  Blood pressure 108/80, pulse 83, temperature 98.6 F (37 C), temperature source Oral, height 5' 4"  (1.626 m), weight 116 lb (52.6 kg), SpO2 98 %.  Physical Exam  Constitutional: She is oriented to person, place, and time and well-developed, well-nourished, and in no distress.  HENT:  Head: Normocephalic and atraumatic.  Mouth/Throat: Mucous membranes are normal.  Eyes: Pupils are equal, round, and reactive to light. EOM are normal. No scleral icterus.  Neck: Neck supple.  Pulmonary/Chest: Effort normal.  Neurological: She is alert and oriented to person, place, and time. Gait normal.  Skin: Skin is warm and dry.  Psychiatric: Mood and affect normal.  Nursing note and vitals  reviewed.     ASSESSMENT and PLAN  1. Bronchospasm Patient doing much better today. Discussed environmental triggers. Discussed sx of concern, consider starting low dose ICS for return of bronchospasm given known mold exposure.   Return if symptoms worsen or fail to improve.    Rutherford Guys, MD Primary Care at [place], [place] [address] Ph.  [phone] Fax [phone]

## 2017-09-07 NOTE — Patient Instructions (Signed)
     IF you received an x-ray today, you will receive an invoice from [facility]. Please contact  Radiology at [phone] with questions or concerns regarding your invoice.   IF you received labwork today, you will receive an invoice from [facility]. Please contact [facility] at [phone] with questions or concerns regarding your invoice.   Our billing staff will not be able to assist you with questions regarding bills from these companies.  You will be contacted with the lab results as soon as they are available. The fastest way to get your results is to activate your My Chart account. Instructions are located on the last page of this paperwork. If you have not heard from us regarding the results in 2 weeks, please contact this office.     

## 2017-09-07 NOTE — Telephone Encounter (Signed)
Noted.  Patient has appointment today with [place].  No further action warranted.

## 2017-09-19 ENCOUNTER — Encounter: Payer: Self-pay | Admitting: Physician Assistant

## 2017-09-26 ENCOUNTER — Encounter: Payer: Self-pay | Admitting: Family Medicine

## 2017-10-05 ENCOUNTER — Encounter: Payer: Self-pay | Admitting: Family Medicine

## 2017-10-05 ENCOUNTER — Other Ambulatory Visit: Payer: Self-pay

## 2017-10-05 ENCOUNTER — Ambulatory Visit (INDEPENDENT_AMBULATORY_CARE_PROVIDER_SITE_OTHER): Payer: BC Managed Care – PPO | Admitting: Family Medicine

## 2017-10-05 VITALS — BP 110/60 | HR 83 | Temp 98.4°F | Ht 64.0 in | Wt 117.6 lb

## 2017-10-05 DIAGNOSIS — R519 Headache, unspecified: Secondary | ICD-10-CM

## 2017-10-05 DIAGNOSIS — J309 Allergic rhinitis, unspecified: Secondary | ICD-10-CM

## 2017-10-05 DIAGNOSIS — R51 Headache: Secondary | ICD-10-CM

## 2017-10-05 MED ORDER — FLUTICASONE PROPIONATE 50 MCG/ACT NA SUSP: 2.0000 | Freq: Every day | NASAL | 6 refills | Status: AC

## 2017-10-05 NOTE — Progress Notes (Signed)
Subjective:  By signing my name below, I, Lori Hutchinson, attest that this documentation has been prepared under the direction and in the presence of Lori Agreste, MD Electronically Signed: Ladene Artist, ED Scribe 10/05/2017 at 10:44 AM.   Patient ID: Lori Hutchinson, female    DOB: 1975-09-03, 42 y.o.   MRN: [id number]  Chief Complaint  Patient presents with  . left side face pressure    mostly behind the L.eye and side of nose and on the cheeks area. Compared to feeling like someone is steping on L side of face    HPI Lori Hutchinson is a 42 y.o. female who presents to Primary Care at [facility] complaining of intermittent L sided facial pain. See e-mail regarding pain on L side of her face. Reportedly had seen optometrist for L facial pain and behind the L eye. Seen about 10 days ago by optho. Shingles was discussed as a possibly but no rash. Denied HA or fever on that e-mail.   Pt states symptoms began ~ 2 wks ago after using Zaditor eye drops. States she used eye drops twice for a blister that she had on her L eye which appeared after going out of town. She reports pain that is the most severe behind the L eye, some in the lateral portion of her nose and the top of her L cheek. States pain has gradually improved, was constant pain initially, intermittent pressure now. She does report some minimal congestion which she states is chronic. She is not taking any allergy meds as Claritin makes her sleepy and Zyrtec causes a change in her taste buds. Denies similar sinus pressure in the past, sneezing, weakness, facial drop, HA, rash.  Patient Active Problem List   Diagnosis Date Noted  . Chronic migraine 03/13/2017  . Dizziness 03/13/2017  . Sinus bradycardia   . 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 Anemia 12/20/2011  . Borderline personality disorder ([place]) 12/20/2011  . 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 PMS (premenstrual syndrome) 12/20/2011   Past Medical History:    Diagnosis Date  . Abnormal Pap smear 1996   Treatment cryosurgery  . Asthma   . Bacterial infection   . Dizziness   . H/O borderline personality disorder 12/23/10  . H/O metrorrhagia 06/09/11  . H/O varicella   . Rhabdomyolysis   . Yeast infection    Past Surgical History:  Procedure Laterality Date  . WISDOM TOOTH EXTRACTION     Allergies  Allergen Reactions  . Erythromycin Nausea And Vomiting  . Percocet [Oxycodone-Acetaminophen] Nausea And Vomiting  . Shellfish Allergy Nausea And Vomiting    Stomach burning-- pt was unable to eat for days following reaction   . Iodine Rash  . Latex Rash   Prior to Admission medications   Not on File   Social History   Socioeconomic History  . Marital status: Single    Spouse name: Not on file  . Number of children: 0  . Years of education: PhD  . Highest education level: Not on file  Occupational History  . Occupation: Professor    Comment: Hydrologist / [employer]  Social Needs  . Financial resource strain: Not on file  . Food insecurity:    Worry: Not on file    Inability: Not on file  . Transportation needs:    Medical: Not on file    Non-medical: Not on file  Tobacco Use  . Smoking status: Former Smoker    Years: 13.00  Last attempt to quit: 06/18/2002    Years since quitting: 15.3  . Smokeless tobacco: Never Used  Substance and Sexual Activity  . Alcohol use: No    Alcohol/week: 0.0 oz    Comment: Quit 2007  . Drug use: No    Comment: Quit 2009  . Sexual activity: Yes    Partners: Male    Comment: lo loestrin  Lifestyle  . Physical activity:    Days per week: Not on file    Minutes per session: Not on file  . Stress: Not on file  Relationships  . Social connections:    Talks on phone: Not on file    Gets together: Not on file    Attends religious service: Not on file    Active member of club or organization: Not on file    Attends meetings of clubs or organizations: Not on file    Relationship status:  Not on file  . Intimate partner violence:    Fear of current or ex partner: Not on file    Emotionally abused: Not on file    Physically abused: Not on file    Forced sexual activity: Not on file  Other Topics Concern  . Not on file  Social History Narrative   Lives at home alone.   Right-handed.   2-4 cups of tea, 1-2 sodas (20oz bottles) per day.   Review of Systems  HENT: Positive for congestion (minimal), sinus pressure and sinus pain. Negative for rhinorrhea and sneezing.   Skin: Negative for rash.  Neurological: Negative for facial asymmetry and headaches.      Objective:   Physical Exam  Constitutional: She is oriented to person, place, and time. She appears well-developed and well-nourished. No distress.  HENT:  Head: Normocephalic and atraumatic.  Right Ear: Hearing, tympanic membrane, external ear and ear canal normal.  Left Ear: Hearing, tympanic membrane, external ear and ear canal normal.  Nose: Nose normal.  Mouth/Throat: Oropharynx is clear and moist. No oropharyngeal exudate.  TMs pearly grey, no rash or blisters in canals or externally.  Eyes: Pupils are equal, round, and reactive to light. Conjunctivae and EOM are normal. Right conjunctiva is not injected. Left conjunctiva is not injected.  Lids appear normal.  Cardiovascular: Normal rate, regular rhythm, normal heart sounds and intact distal pulses.  No murmur heard. Pulmonary/Chest: Effort normal and breath sounds normal. No respiratory distress. She has no wheezes. She has no rhonchi.  Neurological: She is alert and oriented to person, place, and time.  Skin: Skin is warm and dry. No rash noted.  Psychiatric: She has a normal mood and affect. Her behavior is normal.  Vitals reviewed.  Vitals:   10/05/17 1025  BP: 110/60  Pulse: 83  Temp: 98.4 F (36.9 C)  TempSrc: Oral  SpO2: 99%  Weight: 117 lb 9.6 oz (53.3 kg)  Height: 5' 4"  (1.626 m)      Assessment & Plan:   Lori Hutchinson is a 42 y.o.  female Left-sided face pain - Plan: fluticasone (FLONASE) 50 MCG/ACT nasal spray  Allergic rhinitis, unspecified seasonality, unspecified trigger - Plan: fluticasone (FLONASE) 50 MCG/ACT nasal spray  Possible sinus cause, allergic cause without definite signs of sinus infection at this time without discolored nasal discharge.  Differential does include ethmoid or sphenoid sinusitis, but less likely, and some improvement of symptoms recently.  We will initially try Flonase nasal spray for allergic cause, then update within the next week and symptoms to consider facial/limited CT.  differential also includes trigeminal neuralgia, but will try initial approach as above.  RTC precautions if worsening.  Meds ordered this encounter  Medications  . fluticasone (FLONASE) 50 MCG/ACT nasal spray    Sig: Place 2 sprays into both nostrils daily.    Dispense:  16 g    Refill:  6   Patient Instructions   Face pain may be due to a number of causes, but possible allergic cause at this time versus sinusitis as we discussed.  Initially can try Flonase nasal spray for the next 1 week, then if not improving would consider limited CT scan of the sinuses to look for infection or other cause. Send me a Mychart update in next week.  If any worsening of symptoms during that time.,  Please follow-up to discuss change in plan.  Thanks for coming in today.      IF you received an x-ray today, you will receive an invoice from [facility]. Please contact [facility] at [phone] with questions or concerns regarding your invoice.   IF you received labwork today, you will receive an invoice from [facility]. Please contact [facility] at [phone] with questions or concerns regarding your invoice.   Our billing staff will not be able to assist you with questions regarding bills from these companies.  You will be contacted with the lab results as soon as they are available. The fastest way to get your  results is to activate your My Chart account. Instructions are located on the last page of this paperwork. If you have not heard from [place] regarding the results in 2 weeks, please contact this office.       I personally performed the services described in this documentation, which was scribed in my presence. The recorded information has been reviewed and considered for accuracy and completeness, addended by me as needed, and agree with information above.  Signed,   [name], MD Primary Care at [facility].  10/05/17 10:59 AM

## 2017-10-05 NOTE — Patient Instructions (Addendum)
Face pain may be due to a number of causes, but possible allergic cause at this time versus sinusitis as we discussed.  Initially can try Flonase nasal spray for the next 1 week, then if not improving would consider limited CT scan of the sinuses to look for infection or other cause. Send me a Mychart update in next week.  If any worsening of symptoms during that time.,  Please follow-up to discuss change in plan.  Thanks for coming in today.      IF you received an x-ray today, you will receive an invoice from [facility]. Please contact [facility] at [phone] with questions or concerns regarding your invoice.   IF you received labwork today, you will receive an invoice from [place]. Please contact [facility] at [phone] with questions or concerns regarding your invoice.   Our billing staff will not be able to assist you with questions regarding bills from these companies.  You will be contacted with the lab results as soon as they are available. The fastest way to get your results is to activate your My Chart account. Instructions are located on the last page of this paperwork. If you have not heard from [place] regarding the results in 2 weeks, please contact this office.

## 2017-10-09 ENCOUNTER — Encounter: Payer: Self-pay | Admitting: Family Medicine

## 2017-10-14 ENCOUNTER — Encounter: Payer: Self-pay | Admitting: Neurology

## 2017-11-01 ENCOUNTER — Encounter: Payer: Self-pay | Admitting: Family Medicine

## 2017-11-13 ENCOUNTER — Encounter: Payer: Self-pay | Admitting: Family Medicine

## 2018-02-21 ENCOUNTER — Ambulatory Visit: Payer: BC Managed Care – PPO | Admitting: Neurology

## 2018-02-21 ENCOUNTER — Encounter

## 2018-11-13 IMAGING — DX DG CHEST 2V
2 series · 2 of 2 positions shown · non-contrast
Comparison: None.

CLINICAL DATA: Cough for 3 weeks with shortness of breath

EXAM:
CHEST - 2 VIEW

[chest pa]
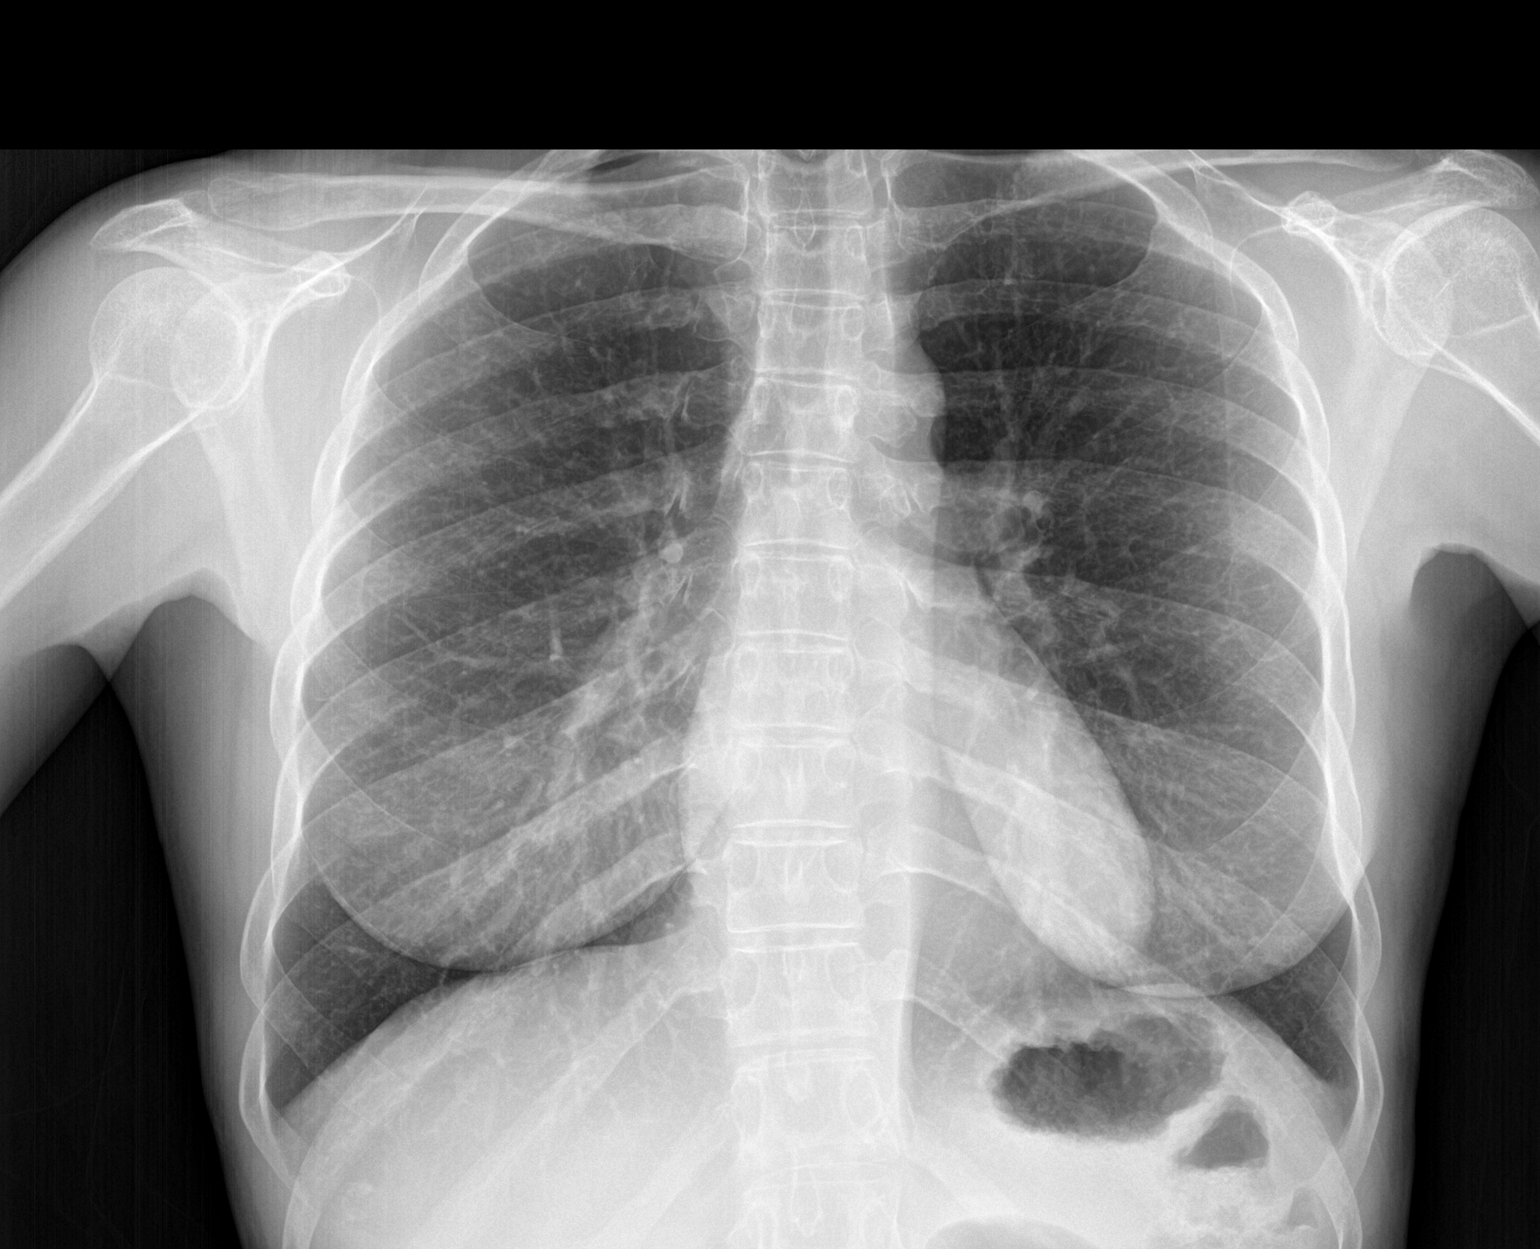

[chest lat]
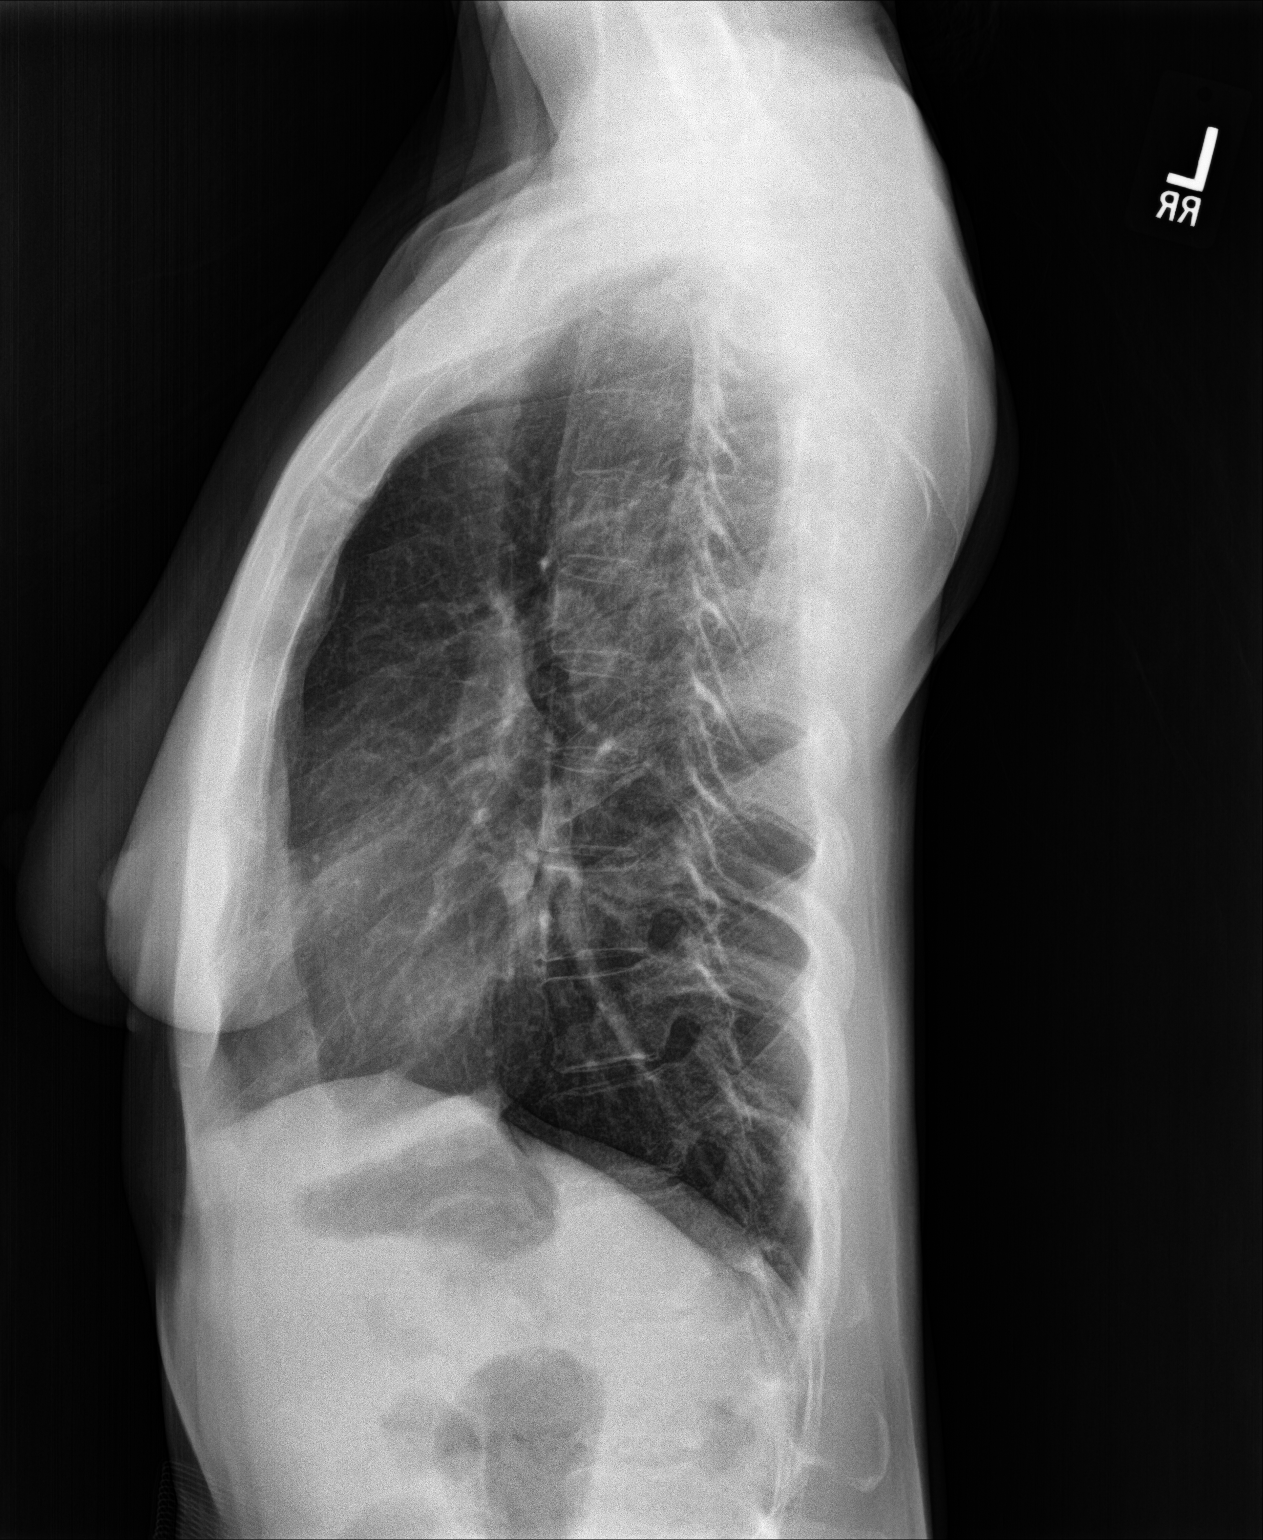

[2 of 2 positions shown; findings below may reference images not displayed]

FINDINGS: The heart size and mediastinal contours are within normal limits.
Both lungs are clear. Mild scoliosis of the spine.
IMPRESSION: No active cardiopulmonary disease.

## 2023-04-05 ENCOUNTER — Telehealth: Payer: Self-pay

## 2023-04-05 NOTE — Telephone Encounter (Signed)
Pt is established with [facility] and saw [facility] on 04/03/23, has an appt scheduled for 11/2023 with [facility].... Pt needs to continue care with current GI, we will not be able to see patient as we are not giving 2nd opinions and are seeing patients who are not established with a GI

## 2023-04-30 ENCOUNTER — Ambulatory Visit: Payer: BC Managed Care – PPO | Admitting: Gastroenterology
